# Patient Record
Sex: Male | Born: 1965 | Race: White | Hispanic: No | Marital: Married | Smoking: Never smoker
Health system: Southern US, Community
[De-identification: ages and names within clinical notes are randomized; demographics above are authoritative.]

## PROBLEM LIST (undated history)

## (undated) DIAGNOSIS — D649 Anemia, unspecified: Secondary | ICD-10-CM

## (undated) DIAGNOSIS — G709 Myoneural disorder, unspecified: Secondary | ICD-10-CM

## (undated) DIAGNOSIS — I10 Essential (primary) hypertension: Secondary | ICD-10-CM

## (undated) DIAGNOSIS — M1990 Unspecified osteoarthritis, unspecified site: Secondary | ICD-10-CM

## (undated) HISTORY — PX: KNEE ARTHROSCOPY WITH ANTERIOR CRUCIATE LIGAMENT (ACL) REPAIR: SHX5644

---
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
## 2001-04-17 ENCOUNTER — Encounter: Admission: RE | Admit: 2001-04-17 | Discharge: 2001-04-17 | Payer: Self-pay | Admitting: Orthopedic Surgery

## 2001-04-17 ENCOUNTER — Encounter: Payer: Self-pay | Admitting: Orthopedic Surgery

## 2006-11-09 ENCOUNTER — Encounter: Admission: RE | Admit: 2006-11-09 | Discharge: 2006-11-09 | Payer: Self-pay | Admitting: Sports Medicine

## 2013-10-09 ENCOUNTER — Encounter (HOSPITAL_COMMUNITY): Payer: Self-pay | Admitting: Pharmacy Technician

## 2013-10-10 NOTE — H&P (Signed)
TOTAL HIP ADMISSION H&P  Patient is admitted for left total hip arthroplasty, anterior approach.  Subjective:  Chief Complaint:    Left hip OA / pain  HPI: Clinton Williams, 47 y.o. male, has a history of pain and functional disability in the right hip(s) due to arthritis and patient has failed non-surgical conservative treatments for greater than 12 weeks to include NSAID's and/or analgesics, corticosteriod injections, use of assistive devices and activity modification.  Onset of symptoms was gradual starting 2+ years ago with gradually worsening course since that time.The patient noted no past surgery on the left hip(s).  Patient currently rates pain in the left hip at 10 out of 10 with activity. Patient has night pain, worsening of pain with activity and weight bearing, trendelenberg gait, pain that interfers with activities of daily living and pain with passive range of motion. Patient has evidence of periarticular osteophytes and joint space narrowing by imaging studies. This condition presents safety issues increasing the risk of falls.  There is no current active infection.  Risks, benefits and expectations were discussed with the patient.  Risks including but not limited to the risk of anesthesia, blood clots, nerve damage, blood vessel damage, failure of the prosthesis, infection and up to and including death.  Patient understand the risks, benefits and expectations and wishes to proceed with surgery.   PCP: No primary provider on file.  D/C Plans:      Home with HHPT  Post-op Meds:       No Rx given  Tranexamic Acid:      To be given - IV    Decadron:      Is to be given  FYI:     ASA post-op  Norco post-op     Past Medical History  Diagnosis Date  . Hypertension   . Neuromuscular disorder     oa     Past Surgical History  Procedure Laterality Date  . Knee arthroscopy with anterior cruciate ligament (acl) repair Bilateral 2003 and 1996  . Knee arthrscopy Right 1981    No  Known Allergies    History  Substance Use Topics  . Smoking status: Never Smoker   . Smokeless tobacco: Never Used  . Alcohol Use: Yes     Comment: occasional     Review of Systems  Constitutional: Negative.   HENT: Negative.   Eyes: Negative.   Respiratory: Negative.   Cardiovascular: Negative.   Gastrointestinal: Negative.   Genitourinary: Negative.   Musculoskeletal: Positive for joint pain.  Skin: Negative.   Neurological: Negative.   Endo/Heme/Allergies: Negative.   Psychiatric/Behavioral: Negative.     Objective:  Physical Exam  Constitutional: He is oriented to person, place, and time. He appears well-developed and well-nourished.  HENT:  Head: Normocephalic and atraumatic.  Eyes: Pupils are equal, round, and reactive to light.  Neck: Neck supple. No JVD present. No tracheal deviation present. No thyromegaly present.  Cardiovascular: Normal rate, regular rhythm, normal heart sounds and intact distal pulses.   Respiratory: Effort normal and breath sounds normal. No stridor. No respiratory distress. He has no wheezes.  GI: Soft. There is no tenderness. There is no guarding.  Musculoskeletal:       Left hip: He exhibits decreased range of motion, decreased strength, tenderness and bony tenderness. He exhibits no swelling, no deformity and no laceration.  Lymphadenopathy:    He has no cervical adenopathy.  Neurological: He is alert and oriented to person, place, and time.  Skin: Skin is  warm and dry.  Psychiatric: He has a normal mood and affect.      Imaging Review Plain radiographs demonstrate severe degenerative joint disease of the left hip(s). The bone quality appears to be good for age and reported activity level.  Assessment/Plan:  End stage arthritis, left hip(s)  The patient history, physical examination, clinical judgement of the provider and imaging studies are consistent with end stage degenerative joint disease of the left hip(s) and total hip  arthroplasty is deemed medically necessary. The treatment options including medical management, injection therapy, arthroscopy and arthroplasty were discussed at length. The risks and benefits of total hip arthroplasty were presented and reviewed. The risks due to aseptic loosening, infection, stiffness, dislocation/subluxation,  thromboembolic complications and other imponderables were discussed.  The patient acknowledged the explanation, agreed to proceed with the plan and consent was signed. Patient is being admitted for inpatient treatment for surgery, pain control, PT, OT, prophylactic antibiotics, VTE prophylaxis, progressive ambulation and ADL's and discharge planning.The patient is planning to be discharged home with home health services.     Anastasio Auerbach Evani Shrider   PA-C  10/10/2013, 11:12 AM

## 2013-10-16 ENCOUNTER — Other Ambulatory Visit (HOSPITAL_COMMUNITY): Payer: Self-pay | Admitting: Anesthesiology

## 2013-10-16 NOTE — Progress Notes (Signed)
ekg and medical clearance note dr Caryn Beekevin little 08-29-13 on chart

## 2013-10-16 NOTE — Patient Instructions (Addendum)
18 Clinton Williams  10/16/2013   Your procedure is scheduled on: Tuesday oct 6th, 2015  [facility] and follow signs to  Short Stay Center at 830 AM.  Call this number if you have problems the morning of surgery [phone]   Remember:  Do not eat food or drink liquids :After Midnight.     Take these medicines the morning of surgery with A SIP OF WATER: no meds to take                               You may not have any metal on your body including hair pins and piercings  Do not wear jewelry, make-up, lotions, powders, or deodorant.   Men may shave face and neck.  Do not bring valuables to the hospital. [facility] IS NOT RESPONSIBLE FOR VALUABLES.  Contacts, dentures or bridgework may not be worn into surgery.  Leave suitcase in the car. After surgery it may be brought to your room.  For patients admitted to the hospital, checkout time is 11:00 AM the day of discharge.   Patients discharged the day of surgery will not be allowed to drive home.  Name and phone number of your driver:  Special Instructions: N/A ________________________________________________________________________  [facility] - Preparing for Surgery Before surgery, you can play an important role.  Because skin is not sterile, your skin needs to be as free of germs as possible.  You can reduce the number of germs on your skin by washing with CHG (chlorahexidine gluconate) soap before surgery.  CHG is an antiseptic cleaner which kills germs and bonds with the skin to continue killing germs even after washing. Please DO NOT use if you have an allergy to CHG or antibacterial soaps.  If your skin becomes reddened/irritated stop using the CHG and inform your nurse when you arrive at Short Stay. Do not shave (including legs and underarms) for at least 48 hours prior to the first CHG shower.  You may shave your face/neck. Please follow these instructions carefully:  1.  Shower with CHG Soap the  night before surgery and the  morning of Surgery.  2.  If you choose to wash your hair, wash your hair first as usual with your  normal  shampoo.  3.  After you shampoo, rinse your hair and body thoroughly to remove the  shampoo.                           4.  Use CHG as you would any other liquid soap.  You can apply chg directly  to the skin and wash                       Gently with a scrungie or clean washcloth.  5.  Apply the CHG Soap to your body ONLY FROM THE NECK DOWN.   Do not use on face/ open                           Wound or open sores. Avoid contact with eyes, ears mouth and genitals (private parts).                       Wash face,  Genitals (private parts) with your normal soap.  6.  Wash thoroughly, paying special attention to the area where your surgery  will be performed.  7.  Thoroughly rinse your body with warm water from the neck down.  8.  DO NOT shower/wash with your normal soap after using and rinsing off  the CHG Soap.                9.  Pat yourself dry with a clean towel.            10.  Wear clean pajamas.            11.  Place clean sheets on your bed the night of your first shower and do not  sleep with pets. Day of Surgery : Do not apply any lotions/deodorants the morning of surgery.  Please wear clean clothes to the hospital/surgery center.  FAILURE TO FOLLOW THESE INSTRUCTIONS MAY RESULT IN THE CANCELLATION OF YOUR SURGERY PATIENT SIGNATURE_________________________________  NURSE SIGNATURE__________________________________  ________________________________________________________________________   Clinton Williams  An incentive spirometer is a tool that can help keep your lungs clear and active. This tool measures how well you are filling your lungs with each breath. Taking long deep breaths may help reverse or decrease the chance of developing breathing (pulmonary) problems (especially infection) following:  A long period of time when you  are unable to move or be active. BEFORE THE PROCEDURE   If the spirometer includes an indicator to show your best effort, your nurse or respiratory therapist will set it to a desired goal.  If possible, sit up straight or lean slightly forward. Try not to slouch.  Hold the incentive spirometer in an upright position. INSTRUCTIONS FOR USE  1. Sit on the edge of your bed if possible, or sit up as far as you can in bed or on a chair. 2. Hold the incentive spirometer in an upright position. 3. Breathe out normally. 4. Place the mouthpiece in your mouth and seal your lips tightly around it. 5. Breathe in slowly and as deeply as possible, raising the piston or the ball toward the top of the column. 6. Hold your breath for 3-5 seconds or for as long as possible. Allow the piston or ball to fall to the bottom of the column. 7. Remove the mouthpiece from your mouth and breathe out normally. 8. Rest for a few seconds and repeat Steps 1 through 7 at least 10 times every 1-2 hours when you are awake. Take your time and take a few normal breaths between deep breaths. 9. The spirometer may include an indicator to show your best effort. Use the indicator as a goal to work toward during each repetition. 10. After each set of 10 deep breaths, practice coughing to be sure your lungs are clear. If you have an incision (the cut made at the time of surgery), support your incision when coughing by placing a pillow or rolled up towels firmly against it. Once you are able to get out of bed, walk around indoors and cough well. You may stop using the incentive spirometer when instructed by your caregiver.  RISKS AND COMPLICATIONS  Take your time so you do not get dizzy or light-headed.  If you are in pain, you may need to take or ask for pain medication before doing incentive spirometry. It is harder to take a deep breath if you are having pain. AFTER USE  Rest and breathe slowly and easily.  It can be helpful to  keep track of a log of  your progress. Your caregiver can provide you with a simple table to help with this. If you are using the spirometer at home, follow these instructions: [place] IF:   You are having difficultly using the spirometer.  You have trouble using the spirometer as often as instructed.  Your pain medication is not giving enough relief while using the spirometer.  You develop fever of 100.5 F (38.1 C) or higher. SEEK IMMEDIATE MEDICAL CARE IF:   You cough up bloody sputum that had not been present before.  You develop fever of 102 F (38.9 C) or greater.  You develop worsening pain at or near the incision site. MAKE SURE YOU:   Understand these instructions.  Will watch your condition.  Will get help right away if you are not doing well or get worse. Document Released: 05/17/2006 Document Revised: 03/29/2011 Document Reviewed: 07/18/2006 Exit[employer] Patient Information 2014 Exit[employer], [place].   ________________________________________________________________________  WHAT IS A BLOOD TRANSFUSION? Blood Transfusion Information  A transfusion is the replacement of blood or some of its parts. Blood is made up of multiple cells which provide different functions.  Red blood cells carry oxygen and are used for blood loss replacement.  White blood cells fight against infection.  Platelets control bleeding.  Plasma helps clot blood.  Other blood products are available for specialized needs, such as hemophilia or other clotting disorders. BEFORE THE TRANSFUSION  Who gives blood for transfusions?   Healthy volunteers who are fully evaluated to make sure their blood is safe. This is blood bank blood. Transfusion therapy is the safest it has ever been in the practice of medicine. Before blood is taken from a donor, a complete history is taken to make sure that person has no history of diseases nor engages in risky social behavior (examples are intravenous drug  use or sexual activity with multiple partners). The donor's travel history is screened to minimize risk of transmitting infections, such as malaria. The donated blood is tested for signs of infectious diseases, such as HIV and hepatitis. The blood is then tested to be sure it is compatible with you in order to minimize the chance of a transfusion reaction. If you or a relative donates blood, this is often done in anticipation of surgery and is not appropriate for emergency situations. It takes many days to process the donated blood. RISKS AND COMPLICATIONS Although transfusion therapy is very safe and saves many lives, the main dangers of transfusion include:   Getting an infectious disease.  Developing a transfusion reaction. This is an allergic reaction to something in the blood you were given. Every precaution is taken to prevent this. The decision to have a blood transfusion has been considered carefully by your caregiver before blood is given. Blood is not given unless the benefits outweigh the risks. AFTER THE TRANSFUSION  Right after receiving a blood transfusion, you will usually feel much better and more energetic. This is especially true if your red blood cells have gotten low (anemic). The transfusion raises the level of the red blood cells which carry oxygen, and this usually causes an energy increase.  The nurse administering the transfusion will monitor you carefully for complications. HOME CARE INSTRUCTIONS  No special instructions are needed after a transfusion. You may find your energy is better. Speak with your caregiver about any limitations on activity for underlying diseases you may have. SEEK MEDICAL CARE IF:   Your condition is not improving after your transfusion.  You develop redness or  irritation at the intravenous (IV) site. SEEK IMMEDIATE MEDICAL CARE IF:  Any of the following symptoms occur over the next 12 hours:  Shaking chills.  You have a temperature by mouth  above 102 F (38.9 C), not controlled by medicine.  Chest, back, or muscle pain.  People around you feel you are not acting correctly or are confused.  Shortness of breath or difficulty breathing.  Dizziness and fainting.  You get a rash or develop hives.  You have a decrease in urine output.  Your urine turns a dark color or changes to pink, red, or brown. Any of the following symptoms occur over the next 10 days:  You have a temperature by mouth above 102 F (38.9 C), not controlled by medicine.  Shortness of breath.  Weakness after normal activity.  The white part of the eye turns yellow (jaundice).  You have a decrease in the amount of urine or are urinating less often.  Your urine turns a dark color or changes to pink, red, or brown. Document Released: 01/02/2000 Document Revised: 03/29/2011 Document Reviewed: 08/21/2007 [facility] Patient Information 2014 [place], [place].  _______________________________________________________________________

## 2013-10-17 ENCOUNTER — Encounter (HOSPITAL_COMMUNITY)
Admission: RE | Admit: 2013-10-17 | Discharge: 2013-10-17 | Disposition: A | Discharge status: Home / Self Care | Payer: Managed Care, Other (non HMO) | Source: Ambulatory Visit | Attending: Orthopedic Surgery | Admitting: Orthopedic Surgery

## 2013-10-17 ENCOUNTER — Ambulatory Visit (HOSPITAL_COMMUNITY)
Admission: RE | Admit: 2013-10-17 | Discharge: 2013-10-17 | Disposition: A | Discharge status: Home / Self Care | Payer: Managed Care, Other (non HMO) | Source: Ambulatory Visit | Attending: Anesthesiology | Admitting: Anesthesiology

## 2013-10-17 ENCOUNTER — Encounter (HOSPITAL_COMMUNITY): Payer: Self-pay

## 2013-10-17 DIAGNOSIS — M169 Osteoarthritis of hip, unspecified: Secondary | ICD-10-CM | POA: Diagnosis not present

## 2013-10-17 DIAGNOSIS — I10 Essential (primary) hypertension: Secondary | ICD-10-CM | POA: Insufficient documentation

## 2013-10-17 DIAGNOSIS — Z01812 Encounter for preprocedural laboratory examination: Secondary | ICD-10-CM | POA: Diagnosis present

## 2013-10-17 DIAGNOSIS — Z01810 Encounter for preprocedural cardiovascular examination: Secondary | ICD-10-CM | POA: Diagnosis present

## 2013-10-17 DIAGNOSIS — M1610 Unilateral primary osteoarthritis, unspecified hip: Secondary | ICD-10-CM | POA: Diagnosis present

## 2013-10-17 HISTORY — DX: Myoneural disorder, unspecified: G70.9

## 2013-10-17 HISTORY — DX: Essential (primary) hypertension: I10

## 2013-10-17 LAB — BASIC METABOLIC PANEL
ANION GAP: 13 (ref 5–15)
BUN: 25 mg/dL — ABNORMAL HIGH (ref 6–23)
CHLORIDE: 104 meq/L (ref 96–112)
CO2: 24 mEq/L (ref 19–32)
Calcium: 9.9 mg/dL (ref 8.4–10.5)
Creatinine, Ser: 1 mg/dL (ref 0.50–1.35)
GFR calc Af Amer: >90 mL/min (ref >90)
GFR calc non Af Amer: 86 mL/min — ABNORMAL LOW (ref >90)
Glucose, Bld: 87 mg/dL (ref 70–99)
POTASSIUM: 4.5 meq/L (ref 3.7–5.3)
Sodium: 141 mEq/L (ref 137–147)

## 2013-10-17 LAB — URINALYSIS, ROUTINE W REFLEX MICROSCOPIC
BILIRUBIN URINE: NEGATIVE
Glucose, UA: NEGATIVE mg/dL
HGB URINE DIPSTICK: NEGATIVE
KETONES UR: NEGATIVE mg/dL
Leukocytes, UA: NEGATIVE
NITRITE: NEGATIVE
PH: 5.5 (ref 5.0–8.0)
Protein, ur: NEGATIVE mg/dL
Specific Gravity, Urine: 1.025 (ref 1.005–1.030)
Urobilinogen, UA: 0.2 mg/dL (ref 0.0–1.0)

## 2013-10-17 LAB — PROTIME-INR
INR: 0.99 (ref 0.00–1.49)
PROTHROMBIN TIME: 13.1 s (ref 11.6–15.2)

## 2013-10-17 LAB — CBC
HCT: 39.4 % (ref 39.0–52.0)
HEMOGLOBIN: 13.9 g/dL (ref 13.0–17.0)
MCH: 30.3 pg (ref 26.0–34.0)
MCHC: 35.3 g/dL (ref 30.0–36.0)
MCV: 86 fL (ref 78.0–100.0)
PLATELETS: 272 10*3/uL (ref 150–400)
RBC: 4.58 MIL/uL (ref 4.22–5.81)
RDW: 12.9 % (ref 11.5–15.5)
WBC: 6.4 10*3/uL (ref 4.0–10.5)

## 2013-10-17 LAB — APTT: aPTT: 27 seconds (ref 24–37)

## 2013-10-17 LAB — SURGICAL PCR SCREEN
MRSA, PCR: NEGATIVE
Staphylococcus aureus: NEGATIVE

## 2013-10-17 LAB — ABO/RH: ABO/RH(D): O POS

## 2013-10-17 NOTE — Progress Notes (Signed)
10/17/13 0837  OBSTRUCTIVE SLEEP APNEA  Have you ever been diagnosed with sleep apnea through a sleep study? No  Do you snore loudly (loud enough to be heard through closed doors)?  1  Do you often feel tired, fatigued, or sleepy during the daytime? 1  Has anyone observed you stop breathing during your sleep? 0  Do you have, or are you being treated for high blood pressure? 1  BMI more than 35 kg/m2? 0  Age over 50 years old? 1  Neck circumference greater than 40 cm/16 inches? 1  Gender: 1  Obstructive Sleep Apnea Score 6  Score 4 or greater  Results sent to PCP

## 2013-10-23 ENCOUNTER — Encounter (HOSPITAL_COMMUNITY): Payer: Managed Care, Other (non HMO) | Admitting: Anesthesiology

## 2013-10-23 ENCOUNTER — Inpatient Hospital Stay (HOSPITAL_COMMUNITY): Payer: Managed Care, Other (non HMO) | Admitting: Anesthesiology

## 2013-10-23 ENCOUNTER — Inpatient Hospital Stay (HOSPITAL_COMMUNITY)
Admission: RE | Admit: 2013-10-23 | Discharge: 2013-10-24 | DRG: 470 | Disposition: A | Discharge status: Home Health | Payer: Managed Care, Other (non HMO) | Source: Ambulatory Visit | Attending: Orthopedic Surgery | Admitting: Orthopedic Surgery

## 2013-10-23 ENCOUNTER — Encounter (HOSPITAL_COMMUNITY)
Admission: RE | Disposition: A | Discharge status: Home Health | Payer: Self-pay | Source: Ambulatory Visit | Attending: Orthopedic Surgery

## 2013-10-23 ENCOUNTER — Encounter (HOSPITAL_COMMUNITY): Payer: Self-pay | Admitting: Anesthesiology

## 2013-10-23 ENCOUNTER — Inpatient Hospital Stay (HOSPITAL_COMMUNITY): Payer: Managed Care, Other (non HMO)

## 2013-10-23 DIAGNOSIS — Z6828 Body mass index (BMI) 28.0-28.9, adult: Secondary | ICD-10-CM | POA: Diagnosis not present

## 2013-10-23 DIAGNOSIS — M1612 Unilateral primary osteoarthritis, left hip: Secondary | ICD-10-CM | POA: Diagnosis present

## 2013-10-23 DIAGNOSIS — I10 Essential (primary) hypertension: Secondary | ICD-10-CM | POA: Diagnosis present

## 2013-10-23 DIAGNOSIS — E663 Overweight: Secondary | ICD-10-CM | POA: Diagnosis present

## 2013-10-23 DIAGNOSIS — Z96649 Presence of unspecified artificial hip joint: Secondary | ICD-10-CM

## 2013-10-23 DIAGNOSIS — M25552 Pain in left hip: Secondary | ICD-10-CM | POA: Diagnosis present

## 2013-10-23 HISTORY — PX: TOTAL HIP ARTHROPLASTY: SHX124

## 2013-10-23 LAB — TYPE AND SCREEN
ABO/RH(D): O POS
Antibody Screen: NEGATIVE

## 2013-10-23 SURGERY — ARTHROPLASTY, HIP, TOTAL, ANTERIOR APPROACH
Anesthesia: Spinal | Site: Hip | Laterality: Left

## 2013-10-23 MED ORDER — PHENYLEPHRINE 40 MCG/ML (10ML) SYRINGE FOR IV PUSH (FOR BLOOD PRESSURE SUPPORT)
PREFILLED_SYRINGE | INTRAVENOUS | Status: AC
  Filled 2013-10-23: qty 10

## 2013-10-23 MED ORDER — DEXAMETHASONE SODIUM PHOSPHATE 10 MG/ML IJ SOLN
10.0000 mg | Freq: Once | INTRAMUSCULAR | Status: DC
  Filled 2013-10-23: qty 1

## 2013-10-23 MED ORDER — LACTATED RINGERS IV SOLN: INTRAVENOUS | Status: DC

## 2013-10-23 MED ORDER — SODIUM CHLORIDE 0.9 % IR SOLN
Status: DC | PRN
  Administered 2013-10-23: 1000 mL

## 2013-10-23 MED ORDER — CEFAZOLIN SODIUM-DEXTROSE 2-3 GM-% IV SOLR
2.0000 g | INTRAVENOUS | Status: AC
Start: 2013-10-23 — End: 2013-10-23
  Administered 2013-10-23: 2 g via INTRAVENOUS

## 2013-10-23 MED ORDER — ONDANSETRON HCL 4 MG/2ML IJ SOLN: 4.0000 mg | Freq: Four times a day (QID) | INTRAMUSCULAR | Status: DC | PRN

## 2013-10-23 MED ORDER — PROPOFOL 10 MG/ML IV BOLUS
INTRAVENOUS | Status: AC
  Filled 2013-10-23: qty 20

## 2013-10-23 MED ORDER — MAGNESIUM CITRATE PO SOLN: 1.0000 | Freq: Once | ORAL | Status: AC | PRN

## 2013-10-23 MED ORDER — SODIUM CHLORIDE 0.9 % IV SOLN
100.0000 mL/h | INTRAVENOUS | Status: DC
  Administered 2013-10-23: 100 mL/h via INTRAVENOUS
  Filled 2013-10-23 (×4): qty 1000

## 2013-10-23 MED ORDER — PROPOFOL 10 MG/ML IV BOLUS
INTRAVENOUS | Status: DC | PRN
  Administered 2013-10-23: 30 mg via INTRAVENOUS

## 2013-10-23 MED ORDER — METOCLOPRAMIDE HCL 10 MG PO TABS: 5.0000 mg | ORAL_TABLET | Freq: Three times a day (TID) | ORAL | Status: DC | PRN

## 2013-10-23 MED ORDER — HYDROMORPHONE HCL 1 MG/ML IJ SOLN: 0.2500 mg | INTRAMUSCULAR | Status: DC | PRN

## 2013-10-23 MED ORDER — PHENYLEPHRINE HCL 10 MG/ML IJ SOLN
10.0000 mg | INTRAVENOUS | Status: DC | PRN
  Administered 2013-10-23: 25 ug/min via INTRAVENOUS

## 2013-10-23 MED ORDER — ONDANSETRON HCL 4 MG PO TABS: 4.0000 mg | ORAL_TABLET | Freq: Four times a day (QID) | ORAL | Status: DC | PRN

## 2013-10-23 MED ORDER — DEXAMETHASONE SODIUM PHOSPHATE 10 MG/ML IJ SOLN
10.0000 mg | Freq: Once | INTRAMUSCULAR | Status: AC
  Administered 2013-10-23: 10 mg via INTRAVENOUS

## 2013-10-23 MED ORDER — AMLODIPINE BESYLATE 10 MG PO TABS
10.0000 mg | ORAL_TABLET | Freq: Every evening | ORAL | Status: DC
  Filled 2013-10-23 (×2): qty 1

## 2013-10-23 MED ORDER — CEFAZOLIN SODIUM-DEXTROSE 2-3 GM-% IV SOLR: 2.0000 g | INTRAVENOUS | Status: DC

## 2013-10-23 MED ORDER — PROMETHAZINE HCL 25 MG/ML IJ SOLN: 6.2500 mg | INTRAMUSCULAR | Status: DC | PRN

## 2013-10-23 MED ORDER — CEFAZOLIN SODIUM-DEXTROSE 2-3 GM-% IV SOLR
2.0000 g | Freq: Four times a day (QID) | INTRAVENOUS | Status: AC
  Administered 2013-10-23 (×2): 2 g via INTRAVENOUS
  Filled 2013-10-23 (×2): qty 50

## 2013-10-23 MED ORDER — TRANEXAMIC ACID 100 MG/ML IV SOLN
1000.0000 mg | Freq: Once | INTRAVENOUS | Status: AC
  Administered 2013-10-23: 1000 mg via INTRAVENOUS
  Filled 2013-10-23: qty 10

## 2013-10-23 MED ORDER — DOCUSATE SODIUM 100 MG PO CAPS
100.0000 mg | ORAL_CAPSULE | Freq: Two times a day (BID) | ORAL | Status: DC
  Administered 2013-10-23 – 2013-10-24 (×2): 100 mg via ORAL

## 2013-10-23 MED ORDER — LACTATED RINGERS IV SOLN
INTRAVENOUS | Status: DC
  Administered 2013-10-23 (×2): via INTRAVENOUS
  Administered 2013-10-23: 1000 mL via INTRAVENOUS

## 2013-10-23 MED ORDER — PROPOFOL 10 MG/ML IV BOLUS
INTRAVENOUS | Status: AC
Start: 2013-10-23 — End: 2013-10-23
  Filled 2013-10-23: qty 20

## 2013-10-23 MED ORDER — CEFAZOLIN SODIUM-DEXTROSE 2-3 GM-% IV SOLR
INTRAVENOUS | Status: AC
  Filled 2013-10-23: qty 50

## 2013-10-23 MED ORDER — DIPHENHYDRAMINE HCL 25 MG PO CAPS: 25.0000 mg | ORAL_CAPSULE | Freq: Four times a day (QID) | ORAL | Status: DC | PRN

## 2013-10-23 MED ORDER — ALUM & MAG HYDROXIDE-SIMETH 200-200-20 MG/5ML PO SUSP: 30.0000 mL | ORAL | Status: DC | PRN

## 2013-10-23 MED ORDER — METOCLOPRAMIDE HCL 5 MG/ML IJ SOLN: 5.0000 mg | Freq: Three times a day (TID) | INTRAMUSCULAR | Status: DC | PRN

## 2013-10-23 MED ORDER — BISACODYL 10 MG RE SUPP: 10.0000 mg | Freq: Every day | RECTAL | Status: DC | PRN

## 2013-10-23 MED ORDER — METHOCARBAMOL 500 MG PO TABS
500.0000 mg | ORAL_TABLET | Freq: Four times a day (QID) | ORAL | Status: DC | PRN
  Administered 2013-10-24 (×2): 500 mg via ORAL
  Filled 2013-10-23 (×2): qty 1

## 2013-10-23 MED ORDER — POLYETHYLENE GLYCOL 3350 17 G PO PACK
17.0000 g | PACK | Freq: Two times a day (BID) | ORAL | Status: DC
  Administered 2013-10-24: 17 g via ORAL

## 2013-10-23 MED ORDER — CELECOXIB 200 MG PO CAPS
200.0000 mg | ORAL_CAPSULE | Freq: Two times a day (BID) | ORAL | Status: DC
  Administered 2013-10-23 – 2013-10-24 (×2): 200 mg via ORAL
  Filled 2013-10-23 (×3): qty 1

## 2013-10-23 MED ORDER — FENTANYL CITRATE 0.05 MG/ML IJ SOLN
INTRAMUSCULAR | Status: AC
  Filled 2013-10-23: qty 2

## 2013-10-23 MED ORDER — MEPERIDINE HCL 50 MG/ML IJ SOLN: 6.2500 mg | INTRAMUSCULAR | Status: DC | PRN

## 2013-10-23 MED ORDER — MIDAZOLAM HCL 2 MG/2ML IJ SOLN
INTRAMUSCULAR | Status: AC
  Filled 2013-10-23: qty 2

## 2013-10-23 MED ORDER — ASPIRIN EC 325 MG PO TBEC
325.0000 mg | DELAYED_RELEASE_TABLET | Freq: Two times a day (BID) | ORAL | Status: DC
  Administered 2013-10-24: 325 mg via ORAL
  Filled 2013-10-23 (×3): qty 1

## 2013-10-23 MED ORDER — HYDROCODONE-ACETAMINOPHEN 7.5-325 MG PO TABS
1.0000 | ORAL_TABLET | ORAL | Status: DC
  Administered 2013-10-23 – 2013-10-24 (×4): 2 via ORAL
  Administered 2013-10-24: 1 via ORAL
  Administered 2013-10-24: 2 via ORAL
  Filled 2013-10-23 (×6): qty 2

## 2013-10-23 MED ORDER — HYDROCODONE-ACETAMINOPHEN 7.5-325 MG PO TABS: 1.0000 | ORAL_TABLET | ORAL | Status: DC

## 2013-10-23 MED ORDER — CHLORHEXIDINE GLUCONATE 4 % EX LIQD: 60.0000 mL | Freq: Once | CUTANEOUS | Status: DC

## 2013-10-23 MED ORDER — HYDROMORPHONE HCL 1 MG/ML IJ SOLN: 0.5000 mg | INTRAMUSCULAR | Status: DC | PRN

## 2013-10-23 MED ORDER — MIDAZOLAM HCL 5 MG/5ML IJ SOLN
INTRAMUSCULAR | Status: DC | PRN
  Administered 2013-10-23: 2 mg via INTRAVENOUS

## 2013-10-23 MED ORDER — FENTANYL CITRATE 0.05 MG/ML IJ SOLN
INTRAMUSCULAR | Status: DC | PRN
  Administered 2013-10-23: 50 ug via INTRAVENOUS

## 2013-10-23 MED ORDER — MENTHOL 3 MG MT LOZG
1.0000 | LOZENGE | OROMUCOSAL | Status: DC | PRN
  Filled 2013-10-23: qty 9

## 2013-10-23 MED ORDER — METHOCARBAMOL 1000 MG/10ML IJ SOLN
500.0000 mg | Freq: Four times a day (QID) | INTRAMUSCULAR | Status: DC | PRN
  Filled 2013-10-23: qty 5

## 2013-10-23 MED ORDER — FERROUS SULFATE 325 (65 FE) MG PO TABS
325.0000 mg | ORAL_TABLET | Freq: Three times a day (TID) | ORAL | Status: DC
Start: 2013-10-23 — End: 2013-10-24
  Administered 2013-10-24: 325 mg via ORAL
  Filled 2013-10-23 (×5): qty 1

## 2013-10-23 MED ORDER — PHENYLEPHRINE HCL 10 MG/ML IJ SOLN
INTRAMUSCULAR | Status: DC | PRN
  Administered 2013-10-23: 120 ug via INTRAVENOUS
  Administered 2013-10-23: 160 ug via INTRAVENOUS
  Administered 2013-10-23: 120 ug via INTRAVENOUS

## 2013-10-23 MED ORDER — BUPIVACAINE HCL (PF) 0.5 % IJ SOLN
INTRAMUSCULAR | Status: AC
  Filled 2013-10-23: qty 30

## 2013-10-23 MED ORDER — PHENOL 1.4 % MT LIQD
1.0000 | OROMUCOSAL | Status: DC | PRN
  Filled 2013-10-23: qty 177

## 2013-10-23 MED ORDER — PROPOFOL INFUSION 10 MG/ML OPTIME
INTRAVENOUS | Status: DC | PRN
  Administered 2013-10-23: 180 ug/kg/min via INTRAVENOUS

## 2013-10-23 SURGICAL SUPPLY — 40 items
ADH SKN CLS APL DERMABOND .7 (GAUZE/BANDAGES/DRESSINGS) ×1
BAG SPEC THK2 15X12 ZIP CLS (MISCELLANEOUS)
BAG ZIPLOCK 12X15 (MISCELLANEOUS) IMPLANT
CAPT HIP PF COP ×2 IMPLANT
COVER PERINEAL POST (MISCELLANEOUS) ×3 IMPLANT
DERMABOND ADVANCED (GAUZE/BANDAGES/DRESSINGS) ×2
DERMABOND ADVANCED .7 DNX12 (GAUZE/BANDAGES/DRESSINGS) ×1 IMPLANT
DRAPE C-ARM 42X120 X-RAY (DRAPES) ×3 IMPLANT
DRAPE STERI IOBAN 125X83 (DRAPES) ×3 IMPLANT
DRAPE U-SHAPE 47X51 STRL (DRAPES) ×9 IMPLANT
DRSG AQUACEL AG ADV 3.5X10 (GAUZE/BANDAGES/DRESSINGS) ×3 IMPLANT
DURAPREP 26ML APPLICATOR (WOUND CARE) ×3 IMPLANT
ELECT BLADE TIP CTD 4 INCH (ELECTRODE) ×3 IMPLANT
ELECT PENCIL ROCKER SW 15FT (MISCELLANEOUS) IMPLANT
ELECT REM PT RETURN 15FT ADLT (MISCELLANEOUS) IMPLANT
ELECT REM PT RETURN 9FT ADLT (ELECTROSURGICAL) ×3
ELECTRODE REM PT RTRN 9FT ADLT (ELECTROSURGICAL) ×1 IMPLANT
FACESHIELD WRAPAROUND (MASK) ×12 IMPLANT
FACESHIELD WRAPAROUND OR TEAM (MASK) ×4 IMPLANT
GLOVE BIOGEL PI IND STRL 7.5 (GLOVE) ×1 IMPLANT
GLOVE BIOGEL PI IND STRL 8.5 (GLOVE) ×1 IMPLANT
GLOVE BIOGEL PI INDICATOR 7.5 (GLOVE) ×2
GLOVE BIOGEL PI INDICATOR 8.5 (GLOVE) ×2
GLOVE ECLIPSE 8.0 STRL XLNG CF (GLOVE) ×3 IMPLANT
GLOVE ORTHO TXT STRL SZ7.5 (GLOVE) ×6 IMPLANT
GOWN SPEC L3 XXLG W/TWL (GOWN DISPOSABLE) ×3 IMPLANT
GOWN STRL REUS W/TWL LRG LVL3 (GOWN DISPOSABLE) ×3 IMPLANT
HOLDER FOLEY CATH W/STRAP (MISCELLANEOUS) ×3 IMPLANT
KIT BASIN OR (CUSTOM PROCEDURE TRAY) ×3 IMPLANT
PACK TOTAL JOINT (CUSTOM PROCEDURE TRAY) ×3 IMPLANT
SAW OSC TIP CART 19.5X105X1.3 (SAW) ×3 IMPLANT
SUT MNCRL AB 4-0 PS2 18 (SUTURE) ×3 IMPLANT
SUT VIC AB 1 CT1 36 (SUTURE) ×9 IMPLANT
SUT VIC AB 2-0 CT1 27 (SUTURE) ×6
SUT VIC AB 2-0 CT1 TAPERPNT 27 (SUTURE) ×2 IMPLANT
SUT VLOC 180 0 24IN GS25 (SUTURE) ×3 IMPLANT
TOWEL OR 17X26 10 PK STRL BLUE (TOWEL DISPOSABLE) ×3 IMPLANT
TOWEL OR NON WOVEN STRL DISP B (DISPOSABLE) IMPLANT
TRAY FOLEY CATH 14FRSI W/METER (CATHETERS) ×3 IMPLANT
WATER STERILE IRR 1500ML POUR (IV SOLUTION) ×3 IMPLANT

## 2013-10-23 NOTE — Anesthesia Preprocedure Evaluation (Addendum)
Anesthesia Evaluation  Patient identified by MRN, date of birth, ID band Patient awake    Reviewed: Allergy & Precautions, H&P , NPO status , Patient's Chart, lab work & pertinent test results  Airway Mallampati: II TM Distance: >3 FB Neck ROM: Full    Dental no notable dental hx.    Pulmonary neg pulmonary ROS,  breath sounds clear to auscultation  Pulmonary exam normal       Cardiovascular hypertension, Pt. on medications negative cardio ROS  Rhythm:Regular Rate:Normal     Neuro/Psych negative neurological ROS  negative psych ROS   GI/Hepatic negative GI ROS, Neg liver ROS,   Endo/Other  negative endocrine ROS  Renal/GU negative Renal ROS  negative genitourinary   Musculoskeletal negative musculoskeletal ROS (+)   Abdominal   Peds negative pediatric ROS (+)  Hematology negative hematology ROS (+)   Anesthesia Other Findings   Reproductive/Obstetrics negative OB ROS                          Anesthesia Physical Anesthesia Plan  ASA: II  Anesthesia Plan: Spinal   Post-op Pain Management:    Induction: Intravenous  Airway Management Planned: Simple Face Mask  Additional Equipment:   Intra-op Plan:   Post-operative Plan: Extubation in OR  Informed Consent: I have reviewed the patients History and Physical, chart, labs and discussed the procedure including the risks, benefits and alternatives for the proposed anesthesia with the patient or authorized representative who has indicated his/her understanding and acceptance.   Dental advisory given  Plan Discussed with: CRNA  Anesthesia Plan Comments:         Anesthesia Quick Evaluation

## 2013-10-23 NOTE — Plan of Care (Signed)
Problem: Consults Goal: Diagnosis- Total Joint Replacement Left anterior hip     

## 2013-10-23 NOTE — Interval H&P Note (Signed)
History and Physical Interval Note:  10/23/2013 9:53 AM  Clinton Moutonobert Grabski  has presented today for surgery, with the diagnosis of LEFT HIP OA  The various methods of treatment have been discussed with the patient and family. After consideration of risks, benefits and other options for treatment, the patient has consented to  Procedure(s): LEFT TOTAL HIP ARTHROPLASTY ANTERIOR APPROACH (Left) as a surgical intervention .  The patient's history has been reviewed, patient examined, no change in status, stable for surgery.  I have reviewed the patient's chart and labs.  Questions were answered to the patient's satisfaction.     Shelda PalLIN,Tayson Schnelle D

## 2013-10-23 NOTE — Progress Notes (Signed)
[facility] is providing the following services: RW and [employer]  If patient discharges after hours, please call [phone].   Clinton Williams 10/23/2013, 4:42 PM

## 2013-10-23 NOTE — Op Note (Signed)
NAME:  Clinton Williams                ACCOUNT NO.: [id number]      MEDICAL RECORD NO.: [id number]      FACPurcell MoutonLITY:  [facility]      PHYSICIAN:  Durene RomansLIN,[name] D  DATE OF BIRTH:  14-Jun-1965     DATE OF PROCEDURE:  10/23/2013                                 OPERATIVE REPORT         PREOPERATIVE DIAGNOSIS: Left  hip osteoarthritis.      POSTOPERATIVE DIAGNOSIS:  Left hip osteoarthritis.      PROCEDURE:  Left total hip replacement through an anterior approach   utilizing DePuy THR system, component size 52mm pinnacle cup, a size 36+4 neutral   Altrex liner, a size 5 Hi Tri Lock stem with a 36+1.5 delta ceramic   ball.      SURGEON:  [name] D. Charlann Boxerlin, M.D.      ASSISTANT:  Skip MayerBlair Roberts, PA-C      ANESTHESIA:  Spinal.      SPECIMENS:  None.      COMPLICATIONS:  None.      BLOOD LOSS:  375 cc     DRAINS:  None.      INDICATION OF THE PROCEDURE:  Clinton MoutonRobert Williams is a 48 y.o. male who had   presented to office for evaluation of left hip pain.  Radiographs revealed   progressive degenerative changes with bone-on-bone   articulation to the  hip joint.  The patient had painful limited range of   motion significantly affecting their overall quality of life.  The patient was failing to    respond to conservative measures, and at this point was ready   to proceed with more definitive measures.  The patient has noted progressive   degenerative changes in his hip, progressive problems and dysfunction   with regarding the hip prior to surgery.  Consent was obtained for   benefit of pain relief.  Specific risk of infection, DVT, component   failure, dislocation, need for revision surgery, as well discussion of   the anterior versus posterior approach were reviewed.  Consent was   obtained for benefit of anterior pain relief through an anterior   approach.      PROCEDURE IN DETAIL:  The patient was brought to operative theater.   Once adequate anesthesia, preoperative  antibiotics, 2gm of Ancef administered.   The patient was positioned supine on the OSI Hanna table.  Once adequate   padding of boney process was carried out, we had predraped out the hip, and  used fluoroscopy to confirm orientation of the pelvis and position.      The left hip was then prepped and draped from proximal iliac crest to   mid thigh with shower curtain technique.      Time-out was performed identifying the patient, planned procedure, and   extremity.     An incision was then made 2 cm distal and lateral to the   anterior superior iliac spine extending over the orientation of the   tensor fascia lata muscle and sharp dissection was carried down to the   fascia of the muscle and protractor placed in the soft tissues.      The fascia was then incised.  The muscle belly was identified and swept  laterally and retractor placed along the superior neck.  Following   cauterization of the circumflex vessels and removing some pericapsular   fat, a second cobra retractor was placed on the inferior neck.  A third   retractor was placed on the anterior acetabulum after elevating the   anterior rectus.  A L-capsulotomy was along the line of the   superior neck to the trochanteric fossa, then extended proximally and   distally.  Tag sutures were placed and the retractors were then placed   intracapsular.  We then identified the trochanteric fossa and   orientation of my neck cut, confirmed this radiographically   and then made a neck osteotomy with the femur on traction.  The femoral   head was removed without difficulty or complication.  Traction was let   off and retractors were placed posterior and anterior around the   acetabulum.      The labrum and foveal tissue were debrided.  I began reaming with a 61mm   reamer and reamed up to 78mm reamer with good bony bed preparation and a 28mm   cup was chosen.  The final 64mm Pinnacle cup was then impacted under fluoroscopy  to confirm  the depth of penetration and orientation with respect to   abduction.  A screw was placed followed by the hole eliminator.  The final   36+4 neutral Altrex liner was impacted with good visualized rim fit.  The cup was positioned anatomically within the acetabular portion of the pelvis.      At this point, the femur was rolled at 80 degrees.  Further capsule was   released off the inferior aspect of the femoral neck.  I then   released the superior capsule proximally.  The hook was placed laterally   along the femur and elevated manually and held in position with the bed   hook.  The leg was then extended and adducted with the leg rolled to 100   degrees of external rotation.  Once the proximal femur was fully   exposed, I used a box osteotome to set orientation.  I then began   broaching with the starting chili pepper broach and passed this by hand and then broached up to 5.  With the 5 broach in place I chose a high offset neck and did a trial reduction.  The offset was appropriate, leg lengths   appeared to be equal, confirmed radiographically.   Given these findings, I went ahead and dislocated the hip, repositioned all   retractors and positioned the right hip in the extended and abducted position.  The final 5 Hi Tri Lock stem was   chosen and it was impacted down to the level of neck cut.  Based on this   and the trial reduction, a 36+1.5 delta ceramic ball was chosen and   impacted onto a clean and dry trunnion, and the hip was reduced.  The   hip had been irrigated throughout the case again at this point.  I did   reapproximate the superior capsular leaflet to the anterior leaflet   using #1 Vicryl.  The fascia of the   tensor fascia lata muscle was then reapproximated using #1 Vicryl and #0 V-lock sutures.  The   remaining wound was closed with 2-0 Vicryl and running 4-0 Monocryl.   The hip was cleaned, dried, and dressed sterilely using Dermabond and   Aquacel dressing.  She was  then brought   to recovery room  in stable condition tolerating the procedure well.    Skip Mayer, PA-C was present for the entirety of the case involved from   preoperative positioning, perioperative retractor management, general   facilitation of the case, as well as primary wound closure as assistant.            Madlyn Frankel Charlann Boxer, M.D.        10/23/2013 12:36 PM

## 2013-10-23 NOTE — Anesthesia Postprocedure Evaluation (Signed)
  Anesthesia Post-op Note  Patient: Clinton MoutonRobert Foerster  Procedure(s) Performed: Procedure(s) (LRB): LEFT TOTAL HIP ARTHROPLASTY ANTERIOR APPROACH (Left)  Patient Location: PACU  Anesthesia Type: Spinal  Level of Consciousness: awake and alert   Airway and Oxygen Therapy: Patient Spontanous Breathing  Post-op Pain: mild  Post-op Assessment: Post-op Vital signs reviewed, Patient's Cardiovascular Status Stable, Respiratory Function Stable, Patent Airway and No signs of Nausea or vomiting  Last Vitals:  Filed Vitals:   10/23/13 1600  BP: 109/69  Pulse: 79  Temp: 36.6 C  Resp: 18    Post-op Vital Signs: stable   Complications: No apparent anesthesia complications

## 2013-10-23 NOTE — Transfer of Care (Signed)
Immediate Anesthesia Transfer of Care Note  Patient: Clinton MoutonRobert Leyba  Procedure(s) Performed: Procedure(s): LEFT TOTAL HIP ARTHROPLASTY ANTERIOR APPROACH (Left)  Patient Location: PACU  Anesthesia Type:Regional and Spinal  Level of Consciousness: awake, alert  and patient cooperative  Airway & Oxygen Therapy: Patient Spontanous Breathing, Patient connected to nasal cannula oxygen and Patient connected to face mask oxygen  Post-op Assessment: Report given to PACU RN and Post -op Vital signs reviewed and stable  Post vital signs: Reviewed and stable  Complications: No apparent anesthesia complications

## 2013-10-24 DIAGNOSIS — E663 Overweight: Secondary | ICD-10-CM | POA: Diagnosis present

## 2013-10-24 LAB — BASIC METABOLIC PANEL
Anion gap: 12 (ref 5–15)
BUN: 15 mg/dL (ref 6–23)
CALCIUM: 8.4 mg/dL (ref 8.4–10.5)
CHLORIDE: 101 meq/L (ref 96–112)
CO2: 22 mEq/L (ref 19–32)
Creatinine, Ser: 0.92 mg/dL (ref 0.50–1.35)
GFR calc Af Amer: >90 mL/min (ref >90)
GFR calc non Af Amer: >90 mL/min (ref >90)
GLUCOSE: 192 mg/dL — AB (ref 70–99)
POTASSIUM: 4.3 meq/L (ref 3.7–5.3)
SODIUM: 135 meq/L — AB (ref 137–147)

## 2013-10-24 LAB — CBC
HCT: 33.4 % — ABNORMAL LOW (ref 39.0–52.0)
HEMOGLOBIN: 11.5 g/dL — AB (ref 13.0–17.0)
MCH: 29.9 pg (ref 26.0–34.0)
MCHC: 34.4 g/dL (ref 30.0–36.0)
MCV: 86.8 fL (ref 78.0–100.0)
Platelets: 217 10*3/uL (ref 150–400)
RBC: 3.85 MIL/uL — AB (ref 4.22–5.81)
RDW: 13.2 % (ref 11.5–15.5)
WBC: 13.9 10*3/uL — ABNORMAL HIGH (ref 4.0–10.5)

## 2013-10-24 MED ORDER — DSS 100 MG PO CAPS: 100.0000 mg | ORAL_CAPSULE | Freq: Two times a day (BID) | ORAL | Status: DC

## 2013-10-24 MED ORDER — ASPIRIN 325 MG PO TBEC: 325.0000 mg | DELAYED_RELEASE_TABLET | Freq: Two times a day (BID) | ORAL | Status: DC

## 2013-10-24 MED ORDER — HYDROCODONE-ACETAMINOPHEN 7.5-325 MG PO TABS: 1.0000 | ORAL_TABLET | ORAL | Status: DC | PRN

## 2013-10-24 MED ORDER — POLYETHYLENE GLYCOL 3350 17 G PO PACK: 17.0000 g | PACK | Freq: Two times a day (BID) | ORAL | Status: DC

## 2013-10-24 MED ORDER — FERROUS SULFATE 325 (65 FE) MG PO TABS: 325.0000 mg | ORAL_TABLET | Freq: Three times a day (TID) | ORAL | Status: DC

## 2013-10-24 MED ORDER — METHOCARBAMOL 500 MG PO TABS: 500.0000 mg | ORAL_TABLET | Freq: Four times a day (QID) | ORAL | Status: DC | PRN

## 2013-10-24 NOTE — Progress Notes (Signed)
RN reviewed discharge instructions with patient and family. All questions answered.   Paperwork and prescriptions given.   NT rolled patient down in wheelchair to family vehicle.   Patient discharged with DME and has [facility].

## 2013-10-24 NOTE — Evaluation (Signed)
Occupational Therapy Evaluation Patient Details Name: Clinton Williams MRN: [id number] DOB: 1965-09-02 Today's Date: 10/24/2013    History of Present Illness s/p L DA THA   Clinical Impression   This 48 year old man was admitted for the above surgery.  All education was completed.  Pt does not need any further OT at this time.      Follow Up Recommendations  No OT follow up    Equipment Recommendations  3 in 1 bedside comode (delivered)    Recommendations for Other Services       Precautions / Restrictions Precautions Precautions: None Restrictions Weight Bearing Restrictions: No      Mobility Bed Mobility                  Transfers   Equipment used: Rolling walker (2 wheeled)   Sit to Stand: Supervision         General transfer comment: cues for LE placement for comfort    Balance                                            ADL Overall ADL's : Needs assistance/impaired                 Upper Body Dressing : Set up;Sitting   Lower Body Dressing: Supervision/safety;With adaptive equipment;Sit to/from stand   Toilet Transfer: Supervision/safety;BSC;Ambulation       Tub/ Banker: Supervision/safety;Walk-in shower;Ambulation     General ADL Comments: Pt bought AE kit:  educated and used this today to get dressed.  Practiced bathroom transfers and educated on 3:1 for use in shower, having wife wipe legs off afterwards as well as using splash guard and removing back bar if needed over commode.  Educated on tight spaces/sidestepping.     Vision                     Perception     Praxis      Pertinent Vitals/Pain Pain Assessment: No/denies pain     Hand Dominance     Extremity/Trunk Assessment Upper Extremity Assessment Upper Extremity Assessment: Overall WFL for tasks assessed           Communication Communication Communication: No difficulties   Cognition Arousal/Alertness:  Awake/alert Behavior During Therapy: WFL for tasks assessed/performed Overall Cognitive Status: Within Functional Limits for tasks assessed                     General Comments       Exercises       Shoulder Instructions      Home Living Family/patient expects to be discharged to:: Private residence Living Arrangements: Spouse/significant other                 Bathroom Shower/Tub: Occupational psychologist: Standard         Additional Comments: 3:1 and RW delivered to room      Prior Functioning/Environment Level of Independence: Independent             OT Diagnosis: Generalized weakness   OT Problem List:     OT Treatment/Interventions:      OT Goals(Current goals can be found in the care plan section)    OT Frequency:     Barriers to D/C:            Co-evaluation  End of Session    Activity Tolerance: Patient tolerated treatment well Patient left: in chair;with call bell/phone within reach;with family/visitor present   Time: 1552-0802 OT Time Calculation (min): 15 min Charges:  OT General Charges $OT Visit: 1 Procedure OT Evaluation $Initial OT Evaluation Tier I: 1 Procedure OT Treatments $Self Care/Home Management : 8-22 mins G-Codes:    Lakeita Panther 08-Nov-2013, 10:54 AM Lesle Chris, OTR/L [phone] 2013-11-08

## 2013-10-24 NOTE — Plan of Care (Signed)
Problem: Consults Goal: Diagnosis- Total Joint Replacement Outcome: Completed/Met Date Met:  10/24/13 Primary Total Hip LEFT, Anterior

## 2013-10-24 NOTE — Evaluation (Signed)
Physical Therapy Evaluation Patient Details Name: Clinton Williams MRN: [id number] DOB: 1965/01/29 Today's Date: 10/24/2013   History of Present Illness  s/p L DA THA  Clinical Impression  Pt s/p L THR presents with decreased L LE strength/ROM and post op pain limiting functional mobility.  Pt should progress well to d/c home with family assist and HHPT follow up.    Follow Up Recommendations Home health PT    Equipment Recommendations  Rolling walker with 5" wheels    Recommendations for Other Services OT consult     Precautions / Restrictions Precautions Precautions: None Restrictions Weight Bearing Restrictions: No      Mobility  Bed Mobility Overal bed mobility: Needs Assistance Bed Mobility: Supine to Sit     Supine to sit: Min assist     General bed mobility comments: cues for sequence and use of R LE to self assist  Transfers Overall transfer level: Needs assistance Equipment used: Rolling walker (2 wheeled) Transfers: Sit to/from Stand Sit to Stand: Supervision         General transfer comment: cues for LE placement for comfort  Ambulation/Gait Ambulation/Gait assistance: Min assist;Min guard Ambulation Distance (Feet): 250 Feet Assistive device: Rolling walker (2 wheeled) Gait Pattern/deviations: Step-to pattern;Step-through pattern;Decreased step length - right;Decreased step length - left;Shuffle;Trunk flexed     General Gait Details: cues for posture, position from RW and initial sequence.  Progressed to recip gait on second half  Stairs            Wheelchair Mobility    Modified Rankin (Stroke Patients Only)       Balance                                             Pertinent Vitals/Pain Pain Assessment: No/denies pain Pain Score: 2  Pain Location: L hip Pain Descriptors / Indicators: Aching Pain Intervention(s): Limited activity within patient's tolerance;Monitored during session;Ice applied;Premedicated  before session    Home Living Family/patient expects to be discharged to:: Private residence Living Arrangements: Spouse/significant other Available Help at Discharge: Family Type of Home: House Home Access: Stairs to enter Entrance Stairs-Rails: Right Entrance Stairs-Number of Steps: 5 Home Layout: Two level Home Equipment: Cane - single point Additional Comments: 3:1 and RW delivered to room    Prior Function Level of Independence: Independent               Hand Dominance   Dominant Hand: Right    Extremity/Trunk Assessment   Upper Extremity Assessment: Overall WFL for tasks assessed           Lower Extremity Assessment: LLE deficits/detail   LLE Deficits / Details: 2+/5 hip strength with AAROM at hip to 85 flex and 15 abd  Cervical / Trunk Assessment: Normal  Communication   Communication: No difficulties  Cognition Arousal/Alertness: Awake/alert Behavior During Therapy: WFL for tasks assessed/performed Overall Cognitive Status: Within Functional Limits for tasks assessed                      General Comments      Exercises Total Joint Exercises Ankle Circles/Pumps: AROM;Both;15 reps;Supine Quad Sets: AROM;Both;10 reps;Supine Heel Slides: AAROM;Left;15 reps;Supine Hip ABduction/ADduction: AAROM;10 reps;Supine;Left      Assessment/Plan    PT Assessment Patient needs continued PT services  PT Diagnosis Difficulty walking   PT Problem List Decreased strength;Decreased range of  motion;Decreased activity tolerance;Decreased mobility;Decreased knowledge of use of DME;Pain  PT Treatment Interventions DME instruction;Gait training;Stair training;Functional mobility training;Therapeutic activities;Therapeutic exercise;Patient/family education   PT Goals (Current goals can be found in the Care Plan section) Acute Rehab PT Goals Patient Stated Goal: Back in ~6 weeks for other side PT Goal Formulation: With patient Time For Goal Achievement:  10/31/13 Potential to Achieve Goals: Good    Frequency 7X/week   Barriers to discharge        Co-evaluation               End of Session   Activity Tolerance: Patient tolerated treatment well Patient left: in chair;with call bell/phone within reach;with family/visitor present Nurse Communication: Mobility status         Time: [id number] PT Time Calculation (min): 30 min   Charges:   PT Evaluation $Initial PT Evaluation Tier I: 1 Procedure PT Treatments $Gait Training: 8-22 mins $Therapeutic Exercise: 8-22 mins   PT G Codes:          Clinton Williams 10/24/2013, 12:53 PM

## 2013-10-24 NOTE — Progress Notes (Signed)
     Subjective: 1 Day Post-Op Procedure(s) (LRB): LEFT TOTAL HIP ARTHROPLASTY ANTERIOR APPROACH (Left)   Patient reports pain as mild, pain controlled. No events throughout the night. Pain prior to surgery has resolved, no more sharp pains just soreness. Ready to be discharged home if he does well with PT and pain stays controlled.   Objective:   VITALS:   Filed Vitals:   10/24/13 0756  BP: 113/70  Pulse: 70  Temp: 98.2 F (36.8 C)   Resp: 16    Dorsiflexion/Plantar flexion intact Incision: dressing C/D/I No cellulitis present Compartment soft  LABS  Recent Labs  10/24/13 0445  HGB 11.5*  HCT 33.4*  WBC 13.9*  PLT 217     Recent Labs  10/24/13 0445  NA 135*  K 4.3  BUN 15  CREATININE 0.92  GLUCOSE 192*     Assessment/Plan: 1 Day Post-Op Procedure(s) (LRB): LEFT TOTAL HIP ARTHROPLASTY ANTERIOR APPROACH (Left) Foley cath d/c'ed Advance diet Up with therapy D/C IV fluids Discharge home with home health [facility]. Follow up with OLIN,Valeta Paz D in 2 weeks.  Contact information:  [facility] [address], [address] [place] [id number] [phone]    Overweight (BMI 25-29.9) Estimated body mass index is 28.84 kg/(m^2) as calculated from the following:   Height as of this encounter: 5\' 10"  (1.778 m).   Weight as of this encounter: 91.173 kg (201 lb). Patient also counseled that weight may inhibit the healing process Patient counseled that losing weight will help with future health issues      Anastasio AuerbachMatthew S. Samy Ryner   PAC  10/24/2013, 8:18 AM

## 2013-10-24 NOTE — Care Management Note (Addendum)
    Page 1 of 2   10/24/2013     10:26:40 AM CARE MANAGEMENT NOTE 10/24/2013  Patient:  Burkland,Fraser   Account Number:  [id number]  Date Initiated:  10/24/2013  Documentation initiated by:  [facility]  Subjective/Objective Assessment:   adm: LEFT TOTAL HIP ARTHROPLASTY ANTERIOR APPROACH (Left)     Action/Plan:   discharge planning   Anticipated DC Date:  10/24/2013   Anticipated DC Plan:  [place]  CM consult      [facility] Choice  HOME HEALTH   Choice offered to / List presented to:  C-1 Patient   DME arranged  3-N-1  Vassie Moselle      DME agency  [place] arranged  [place].   Status of service:  Completed, signed off Medicare Important Message given?   (If response is "NO", the following Medicare IM given date fields will be blank) Date Medicare IM given:   Medicare IM given by:   Date Additional Medicare IM given:   Additional Medicare IM given by:    Discharge Disposition:  [place]  Per UR Regulation:  Reviewed for med. necessity/level of care/duration of stay  If discussed at [place] of Stay Meetings, dates discussed:    Comments:  10/24/13 07:45 CM met with pt in room to offer choice of home health agency.  Pt chooses [facility]  to render HHPT. Address and contact information verified with request of mobile phone as primary contact number. Though not on facesheet, Pt has Hulan Fess, MD as his PCP.  DME rep, Pura Spice to deliver 3n1 and rolling walker to room prior to dishcarge.  Referral made to [facility] (on unit). No other CM needs were communicated.  Mariane Masters, BSN, CM [phone].

## 2013-10-25 NOTE — Discharge Summary (Signed)
Physician Discharge Summary  Patient ID: Clinton Williams MRN: [id number] DOB/AGE: September 17, 1965 48 y.o.  Admit date: 10/23/2013 Discharge date: 10/24/2013   Procedures:  Procedure(s) (LRB): LEFT TOTAL HIP ARTHROPLASTY ANTERIOR APPROACH (Left)  Attending Physician:  Dr. Durene Romans   Admission Diagnoses:   Left hip OA / pain  Discharge Diagnoses:  Principal Problem:   S/P left THA, AA Active Problems:   Overweight (BMI 25.0-29.9)  Past Medical History  Diagnosis Date  . Hypertension   . Neuromuscular disorder     oa    HPI: [name], 48 y.o. male, has a history of pain and functional disability in the right hip(s) due to arthritis and patient has failed non-surgical conservative treatments for greater than 12 weeks to include NSAID's and/or analgesics, corticosteriod injections, use of assistive devices and activity modification. Onset of symptoms was gradual starting 2+ years ago with gradually worsening course since that time.The patient noted no past surgery on the left hip(s). Patient currently rates pain in the left hip at 10 out of 10 with activity. Patient has night pain, worsening of pain with activity and weight bearing, trendelenberg gait, pain that interfers with activities of daily living and pain with passive range of motion. Patient has evidence of periarticular osteophytes and joint space narrowing by imaging studies. This condition presents safety issues increasing the risk of falls. There is no current active infection. Risks, benefits and expectations were discussed with the patient. Risks including but not limited to the risk of anesthesia, blood clots, nerve damage, blood vessel damage, failure of the prosthesis, infection and up to and including death. Patient understand the risks, benefits and expectations and wishes to proceed with surgery.   PCP: No primary provider on file.   Discharged Condition: good  Hospital Course:  Patient underwent the above stated  procedure on 10/23/2013. Patient tolerated the procedure well and brought to the recovery room in good condition and subsequently to the floor.  POD #1 BP: 113/70 ; Pulse: 70 ; Temp: 98.2 F (36.8 C) ; Resp: 16  Patient reports pain as mild, pain controlled. No events throughout the night. Pain prior to surgery has resolved, no more sharp pains just soreness. Ready to be discharged home. Dorsiflexion/plantar flexion intact, incision: dressing C/D/I, no cellulitis present and compartment soft.   LABS  Basename    HGB  11.5  HCT  33.4    Discharge Exam: General appearance: alert, cooperative and no distress Extremities: Homans sign is negative, no sign of DVT, no edema, redness or tenderness in the calves or thighs and no ulcers, gangrene or trophic changes  Disposition: Home with follow up in 2 weeks   Follow up with OLIN,Melysa Schroyer D in 2 weeks.  Contact information:  [facility] [address] [place] [address] [phone]    Follow-up Information   Follow up with [facility] (3n1(commode) and rolling walker)    Contact information:   [address] [place] [address] [phone]       Follow up with [facility]. (home health physical therapy)    Contact information:   [address] [place] [address] [phone]       Discharge Instructions   Call MD / Call 911    Complete by:  As directed   If you experience chest pain or shortness of breath, CALL 911 and be transported to the hospital emergency room.  If you develope a fever  above 101 F, pus (white drainage) or increased drainage or redness at the wound, or calf pain, call your surgeon's office.     Change dressing    Complete by:  As directed   Maintain surgical dressing for 10-14 days, or until follow up in the clinic.     Constipation Prevention    Complete by:  As directed   Drink plenty of fluids.  Prune  juice may be helpful.  You may use a stool softener, such as Colace (over the counter) 100 mg twice a day.  Use MiraLax (over the counter) for constipation as needed.     Diet - low sodium heart healthy    Complete by:  As directed      Discharge instructions    Complete by:  As directed   Maintain surgical dressing for 10-14 days, or until follow up in the clinic. [facility] Call with any questions or concerns.     Increase activity slowly as tolerated    Complete by:  As directed      TED hose    Complete by:  As directed   Use stockings (TED hose) for 2 weeks on both leg(s).  You may remove them at night for sleeping.     Weight bearing as tolerated    Complete by:  As directed   Laterality:  left  Extremity:  Lower             Medication List    STOP taking these medications       naproxen sodium 220 MG tablet  Commonly known as:  ANAPROX      TAKE these medications       amLODipine 10 MG tablet  Commonly known as:  NORVASC  Take 10 mg by mouth every evening.     aspirin 325 MG EC tablet  Take 1 tablet (325 mg total) by mouth 2 (two) times daily.     DSS 100 MG Caps  Take 100 mg by mouth 2 (two) times daily.     ferrous sulfate 325 (65 FE) MG tablet  Take 1 tablet (325 mg total) by mouth 3 (three) times daily after meals.     HYDROcodone-acetaminophen 7.5-325 MG per tablet  Commonly known as:  NORCO  Take 1-2 tablets by mouth every 4 (four) hours as needed for moderate pain.     lisinopril 40 MG tablet  Commonly known as:  PRINIVIL,ZESTRIL  Take 40 mg by mouth every evening.     methocarbamol 500 MG tablet  Commonly known as:  ROBAXIN  Take 1 tablet (500 mg total) by mouth every 6 (six) hours as needed for muscle spasms.     multivitamin with minerals Tabs tablet  Take 1 tablet by mouth daily.     polyethylene glycol packet  Commonly known as:  MIRALAX / GLYCOLAX  Take 17 g by mouth 2 (two) times daily.            Signed: Anastasio AuerbachMatthew S. Junita Kubota   PA-C  10/25/2013, 9:21 AM

## 2013-11-14 ENCOUNTER — Encounter (HOSPITAL_COMMUNITY): Payer: Self-pay | Admitting: Pharmacy Technician

## 2013-11-14 NOTE — H&P (Signed)
TOTAL HIP ADMISSION H&P  Patient is admitted for right total hip arthroplasty, anterior approach.  Subjective:  Chief Complaint:    Right hip OA / pain  HPI: Purcell Moutonobert Patnaude, 48 y.o. male, has a history of pain and functional disability in the right hip(s) due to arthritis and patient has failed non-surgical conservative treatments for greater than 12 weeks to include NSAID's and/or analgesics, corticosteriod injections, use of assistive devices and activity modification.  Onset of symptoms was gradual starting 2+ years ago with gradually worsening course since that time.The patient noted prior procedures of the hip to include arthroplasty on the left hip(s).  Patient currently rates pain in the right hip at 9 out of 10 with activity. Patient has night pain, worsening of pain with activity and weight bearing, trendelenberg gait, pain that interfers with activities of daily living and pain with passive range of motion. Patient has evidence of periarticular osteophytes and joint space narrowing by imaging studies. This condition presents safety issues increasing the risk of falls.   There is no current active infection.  Risks, benefits and expectations were discussed with the patient.  Risks including but not limited to the risk of anesthesia, blood clots, nerve damage, blood vessel damage, failure of the prosthesis, infection and up to and including death.  Patient understand the risks, benefits and expectations and wishes to proceed with surgery.   PCP: No primary provider on file.  D/C Plans:      Home with HHPT  Post-op Meds:       No Rx given   Tranexamic Acid:      To be given - IV   Decadron:      Is to be given  FYI:     ASA post-op  Norco post-op    Patient Active Problem List   Diagnosis Date Noted  . Overweight (BMI 25.0-29.9) 10/24/2013  . S/P left THA, AA 10/23/2013   Past Medical History  Diagnosis Date  . Hypertension   . Neuromuscular disorder     oa    Past Surgical  History  Procedure Laterality Date  . Knee arthroscopy with anterior cruciate ligament (acl) repair Bilateral 2003 and 1996  . Knee arthrscopy Right 1981  . Total hip arthroplasty Left 10/23/2013    Procedure: LEFT TOTAL HIP ARTHROPLASTY ANTERIOR APPROACH;  Surgeon: Shelda PalMatthew D Olin, MD;  Location: [facility];  Service: Orthopedics;  Laterality: Left;    No prescriptions prior to admission   No Known Allergies   History  Substance Use Topics  . Smoking status: Never Smoker   . Smokeless tobacco: Never Used  . Alcohol Use: Yes     Comment: occasional       Review of Systems  Constitutional: Negative.   HENT: Negative.   Eyes: Negative.   Respiratory: Negative.   Cardiovascular: Negative.   Gastrointestinal: Negative.   Genitourinary: Negative.   Musculoskeletal: Positive for joint pain.  Skin: Negative.   Neurological: Negative.   Endo/Heme/Allergies: Negative.   Psychiatric/Behavioral: Negative.     Objective:  Physical Exam  Constitutional: He is oriented to person, place, and time. He appears well-developed and well-nourished.  HENT:  Head: Normocephalic and atraumatic.  Eyes: Pupils are equal, round, and reactive to light.  Neck: Neck supple. No JVD present. No tracheal deviation present. No thyromegaly present.  Cardiovascular: Normal rate, regular rhythm, normal heart sounds and intact distal pulses.   Respiratory: Effort normal and breath sounds normal. No stridor. No respiratory distress. He has no  wheezes.  GI: Soft. There is no tenderness. There is no guarding.  Musculoskeletal:       Right hip: He exhibits decreased range of motion, decreased strength, tenderness and bony tenderness. He exhibits no swelling, no deformity and no laceration.  Lymphadenopathy:    He has no cervical adenopathy.  Neurological: He is alert and oriented to person, place, and time.  Skin: Skin is warm and dry.  Psychiatric: He has a normal mood and affect.      Labs:  Estimated  body mass index is 28.84 kg/(m^2) as calculated from the following:   Height as of 10/23/13: 5\' 10"  (1.778 m).   Weight as of 10/23/13: 91.173 kg (201 lb).   Imaging Review Plain radiographs demonstrate severe degenerative joint disease of the right hip(s). The bone quality appears to be good for age and reported activity level.  Assessment/Plan:  End stage arthritis, right hip(s)  The patient history, physical examination, clinical judgement of the provider and imaging studies are consistent with end stage degenerative joint disease of the right hip(s) and total hip arthroplasty is deemed medically necessary. The treatment options including medical management, injection therapy, arthroscopy and arthroplasty were discussed at length. The risks and benefits of total hip arthroplasty were presented and reviewed. The risks due to aseptic loosening, infection, stiffness, dislocation/subluxation,  thromboembolic complications and other imponderables were discussed.  The patient acknowledged the explanation, agreed to proceed with the plan and consent was signed. Patient is being admitted for inpatient treatment for surgery, pain control, PT, OT, prophylactic antibiotics, VTE prophylaxis, progressive ambulation and ADL's and discharge planning.The patient is planning to be discharged home with home health services.      Anastasio AuerbachMatthew S. Franki Stemen   PA-C  11/14/2013, 11:15 AM

## 2013-11-19 NOTE — Patient Instructions (Addendum)
Clinton Williams  11/19/2013   Your procedure is scheduled on: 11/27/2013     Come thru the Emergency Room entrance.    Follow the Signs to Short Stay Center at  0515      am  Call this number if you have problems the morning of surgery: [phone]   Remember:   Do not eat food or drink liquids after midnight.   Take these medicines the morning of surgery with A SIP OF WATER: Hydrocodone if needed    Do not wear jewelry,   Do not wear lotions, powders, or perfumes. deodorant.  . Men may shave face and neck.  Do not bring valuables to the hospital.  Contacts, dentures or bridgework may not be worn into surgery.  Leave suitcase in the car. After surgery it may be brought to your room.  For patients admitted to the hospital, checkout time is 11:00 AM the day of  discharge.    [facility] - Preparing for Surgery Before surgery, you can play an important role.  Because skin is not sterile, your skin needs to be as free of germs as possible.  You can reduce the number of germs on your skin by washing with CHG (chlorahexidine gluconate) soap before surgery.  CHG is an antiseptic cleaner which kills germs and bonds with the skin to continue killing germs even after washing. Please DO NOT use if you have an allergy to CHG or antibacterial soaps.  If your skin becomes reddened/irritated stop using the CHG and inform your nurse when you arrive at Short Stay. Do not shave (including legs and underarms) for at least 48 hours prior to the first CHG shower.  You may shave your face/neck. Please follow these instructions carefully:  1.  Shower with CHG Soap the night before surgery and the  morning of Surgery.  2.  If you choose to wash your hair, wash your hair first as usual with your  normal  shampoo.  3.  After you shampoo, rinse your hair and body thoroughly to remove the  shampoo.                           4.  Use CHG as you would any other liquid soap.  You can apply chg directly  to the skin and wash                        Gently with a scrungie or clean washcloth.  5.  Apply the CHG Soap to your body ONLY FROM THE NECK DOWN.   Do not use on face/ open                           Wound or open sores. Avoid contact with eyes, ears mouth and genitals (private parts).                       Wash face,  Genitals (private parts) with your normal soap.             6.  Wash thoroughly, paying special attention to the area where your surgery  will be performed.  7.  Thoroughly rinse your body with warm water from the neck down.  8.  DO NOT shower/wash with your normal soap after using and rinsing off  the CHG Soap.  9.  Pat yourself dry with a clean towel.            10.  Wear clean pajamas.            11.  Place clean sheets on your bed the night of your first shower and do not  sleep with pets. Day of Surgery : Do not apply any lotions/deodorants the morning of surgery.  Please wear clean clothes to the hospital/surgery center.  FAILURE TO FOLLOW THESE INSTRUCTIONS MAY RESULT IN THE CANCELLATION OF YOUR SURGERY PATIENT SIGNATURE_________________________________  NURSE SIGNATURE__________________________________  ________________________________________________________________________  WHAT IS A BLOOD TRANSFUSION? Blood Transfusion Information  A transfusion is the replacement of blood or some of its parts. Blood is made up of multiple cells which provide different functions.  Red blood cells carry oxygen and are used for blood loss replacement.  White blood cells fight against infection.  Platelets control bleeding.  Plasma helps clot blood.  Other blood products are available for specialized needs, such as hemophilia or other clotting disorders. BEFORE THE TRANSFUSION  Who gives blood for transfusions?   Healthy volunteers who are fully evaluated to make sure their blood is safe. This is blood bank blood. Transfusion therapy is the safest it has ever been in the  practice of medicine. Before blood is taken from a donor, a complete history is taken to make sure that person has no history of diseases nor engages in risky social behavior (examples are intravenous drug use or sexual activity with multiple partners). The donor's travel history is screened to minimize risk of transmitting infections, such as malaria. The donated blood is tested for signs of infectious diseases, such as HIV and hepatitis. The blood is then tested to be sure it is compatible with you in order to minimize the chance of a transfusion reaction. If you or a relative donates blood, this is often done in anticipation of surgery and is not appropriate for emergency situations. It takes many days to process the donated blood. RISKS AND COMPLICATIONS Although transfusion therapy is very safe and saves many lives, the main dangers of transfusion include:  1. Getting an infectious disease. 2. Developing a transfusion reaction. This is an allergic reaction to something in the blood you were given. Every precaution is taken to prevent this. The decision to have a blood transfusion has been considered carefully by your caregiver before blood is given. Blood is not given unless the benefits outweigh the risks. AFTER THE TRANSFUSION  Right after receiving a blood transfusion, you will usually feel much better and more energetic. This is especially true if your red blood cells have gotten low (anemic). The transfusion raises the level of the red blood cells which carry oxygen, and this usually causes an energy increase.  The nurse administering the transfusion will monitor you carefully for complications. HOME CARE INSTRUCTIONS  No special instructions are needed after a transfusion. You may find your energy is better. Speak with your caregiver about any limitations on activity for underlying diseases you may have. SEEK MEDICAL CARE IF:   Your condition is not improving after your transfusion.  You  develop redness or irritation at the intravenous (IV) site. SEEK IMMEDIATE MEDICAL CARE IF:  Any of the following symptoms occur over the next 12 hours:  Shaking chills.  You have a temperature by mouth above 102 F (38.9 C), not controlled by medicine.  Chest, back, or muscle pain.  People around you feel you are not acting correctly or  are confused.  Shortness of breath or difficulty breathing.  Dizziness and fainting.  You get a rash or develop hives.  You have a decrease in urine output.  Your urine turns a dark color or changes to pink, red, or brown. Any of the following symptoms occur over the next 10 days:  You have a temperature by mouth above 102 F (38.9 C), not controlled by medicine.  Shortness of breath.  Weakness after normal activity.  The white part of the eye turns yellow (jaundice).  You have a decrease in the amount of urine or are urinating less often.  Your urine turns a dark color or changes to pink, red, or brown. Document Released: 01/02/2000 Document Revised: 03/29/2011 Document Reviewed: 08/21/2007 ExitCare Patient Information 2014 [place] [place].  _______________________________________________________________________  Incentive Spirometer  An incentive spirometer is a tool that can help keep your lungs clear and active. This tool measures how well you are filling your lungs with each breath. Taking long deep breaths may help reverse or decrease the chance of developing breathing (pulmonary) problems (especially infection) following:  A long period of time when you are unable to move or be active. BEFORE THE PROCEDURE   If the spirometer includes an indicator to show your best effort, your nurse or respiratory therapist will set it to a desired goal.  If possible, sit up straight or lean slightly forward. Try not to slouch.  Hold the incentive spirometer in an upright position. INSTRUCTIONS FOR USE  3. Sit on the edge of your bed if  possible, or sit up as far as you can in bed or on a chair. 4. Hold the incentive spirometer in an upright position. 5. Breathe out normally. 6. Place the mouthpiece in your mouth and seal your lips tightly around it. 7. Breathe in slowly and as deeply as possible, raising the piston or the ball toward the top of the column. 8. Hold your breath for 3-5 seconds or for as long as possible. Allow the piston or ball to fall to the bottom of the column. 9. Remove the mouthpiece from your mouth and breathe out normally. 10. Rest for a few seconds and repeat Steps 1 through 7 at least 10 times every 1-2 hours when you are awake. Take your time and take a few normal breaths between deep breaths. 11. The spirometer may include an indicator to show your best effort. Use the indicator as a goal to work toward during each repetition. 12. After each set of 10 deep breaths, practice coughing to be sure your lungs are clear. If you have an incision (the cut made at the time of surgery), support your incision when coughing by placing a pillow or rolled up towels firmly against it. Once you are able to get out of bed, walk around indoors and cough well. You may stop using the incentive spirometer when instructed by your caregiver.  RISKS AND COMPLICATIONS  Take your time so you do not get dizzy or light-headed.  If you are in pain, you may need to take or ask for pain medication before doing incentive spirometry. It is harder to take a deep breath if you are having pain. AFTER USE  Rest and breathe slowly and easily.  It can be helpful to keep track of a log of your progress. Your caregiver can provide you with a simple table to help with this. If you are using the spirometer at home, follow these instructions: SEEK MEDICAL CARE IF:  You are having difficultly using the spirometer.  You have trouble using the spirometer as often as instructed.  Your pain medication is not giving enough relief while using  the spirometer.  You develop fever of 100.5 F (38.1 C) or higher. SEEK IMMEDIATE MEDICAL CARE IF:   You cough up bloody sputum that had not been present before.  You develop fever of 102 F (38.9 C) or greater.  You develop worsening pain at or near the incision site. MAKE SURE YOU:   Understand these instructions.  Will watch your condition.  Will get help right away if you are not doing well or get worse. Document Released: 05/17/2006 Document Revised: 03/29/2011 Document Reviewed: 07/18/2006 ExitCare Patient Information 2014 [employer], [place].   ________________________________________________________________________   Please read over the following fact sheets that you were given: MRSA Information, coughing and deep breathing exercises, leg exercises

## 2013-11-20 ENCOUNTER — Encounter (HOSPITAL_COMMUNITY)
Admission: RE | Admit: 2013-11-20 | Discharge: 2013-11-20 | Disposition: A | Discharge status: Home / Self Care | Payer: Managed Care, Other (non HMO) | Source: Ambulatory Visit | Attending: Orthopedic Surgery | Admitting: Orthopedic Surgery

## 2013-11-20 ENCOUNTER — Encounter (HOSPITAL_COMMUNITY): Payer: Self-pay

## 2013-11-20 DIAGNOSIS — Z01818 Encounter for other preprocedural examination: Secondary | ICD-10-CM | POA: Insufficient documentation

## 2013-11-20 HISTORY — DX: Anemia, unspecified: D64.9

## 2013-11-20 HISTORY — DX: Unspecified osteoarthritis, unspecified site: M19.90

## 2013-11-20 LAB — BASIC METABOLIC PANEL
Anion gap: 14 (ref 5–15)
BUN: 17 mg/dL (ref 6–23)
CALCIUM: 9.9 mg/dL (ref 8.4–10.5)
CO2: 24 meq/L (ref 19–32)
CREATININE: 0.94 mg/dL (ref 0.50–1.35)
Chloride: 106 mEq/L (ref 96–112)
GFR calc Af Amer: >90 mL/min (ref >90)
GFR calc non Af Amer: >90 mL/min (ref >90)
GLUCOSE: 78 mg/dL (ref 70–99)
Potassium: 4.4 mEq/L (ref 3.7–5.3)
Sodium: 144 mEq/L (ref 137–147)

## 2013-11-20 LAB — URINALYSIS, ROUTINE W REFLEX MICROSCOPIC
Bilirubin Urine: NEGATIVE
GLUCOSE, UA: NEGATIVE mg/dL
Hgb urine dipstick: NEGATIVE
Ketones, ur: NEGATIVE mg/dL
Leukocytes, UA: NEGATIVE
Nitrite: NEGATIVE
PROTEIN: NEGATIVE mg/dL
Specific Gravity, Urine: 1.021 (ref 1.005–1.030)
Urobilinogen, UA: 0.2 mg/dL (ref 0.0–1.0)
pH: 5.5 (ref 5.0–8.0)

## 2013-11-20 LAB — CBC
HEMATOCRIT: 38.4 % — AB (ref 39.0–52.0)
Hemoglobin: 13.2 g/dL (ref 13.0–17.0)
MCH: 29.9 pg (ref 26.0–34.0)
MCHC: 34.4 g/dL (ref 30.0–36.0)
MCV: 86.9 fL (ref 78.0–100.0)
Platelets: 244 10*3/uL (ref 150–400)
RBC: 4.42 MIL/uL (ref 4.22–5.81)
RDW: 12.7 % (ref 11.5–15.5)
WBC: 5.6 10*3/uL (ref 4.0–10.5)

## 2013-11-20 LAB — PROTIME-INR
INR: 0.97 (ref 0.00–1.49)
Prothrombin Time: 13 seconds (ref 11.6–15.2)

## 2013-11-20 LAB — APTT: aPTT: 30 seconds (ref 24–37)

## 2013-11-20 LAB — SURGICAL PCR SCREEN
MRSA, PCR: NEGATIVE
Staphylococcus aureus: NEGATIVE

## 2013-11-20 NOTE — Progress Notes (Addendum)
Patient has no antibiotic ordered preop for surgery on 11/27/2013.

## 2013-11-20 NOTE — Progress Notes (Signed)
EKG- 08/29/13 CHART CXR- 09/19/2013 EPIC

## 2013-11-26 NOTE — Anesthesia Preprocedure Evaluation (Signed)
Anesthesia Evaluation  Patient identified by MRN, date of birth, ID band Patient awake    Reviewed: Allergy & Precautions, H&P , NPO status , Patient's Chart, lab work & pertinent test results  Airway Mallampati: II  TM Distance: >3 FB Neck ROM: Full    Dental no notable dental hx.    Pulmonary neg pulmonary ROS,  breath sounds clear to auscultation  Pulmonary exam normal       Cardiovascular hypertension, Pt. on medications negative cardio ROS  Rhythm:Regular Rate:Normal     Neuro/Psych negative neurological ROS  negative psych ROS   GI/Hepatic negative GI ROS, Neg liver ROS,   Endo/Other  negative endocrine ROS  Renal/GU negative Renal ROS  negative genitourinary   Musculoskeletal negative musculoskeletal ROS (+) Arthritis -, Osteoarthritis,    Abdominal   Peds negative pediatric ROS (+)  Hematology  (+) anemia ,   Anesthesia Other Findings   Reproductive/Obstetrics negative OB ROS                             Anesthesia Physical  Anesthesia Plan  ASA: II  Anesthesia Plan: Spinal   Post-op Pain Management:    Induction: Intravenous  Airway Management Planned: Simple Face Mask  Additional Equipment:   Intra-op Plan:   Post-operative Plan: Extubation in OR  Informed Consent: I have reviewed the patients History and Physical, chart, labs and discussed the procedure including the risks, benefits and alternatives for the proposed anesthesia with the patient or authorized representative who has indicated his/her understanding and acceptance.   Dental advisory given  Plan Discussed with: CRNA  Anesthesia Plan Comments:         Anesthesia Quick Evaluation

## 2013-11-27 ENCOUNTER — Inpatient Hospital Stay (HOSPITAL_COMMUNITY): Payer: Managed Care, Other (non HMO)

## 2013-11-27 ENCOUNTER — Encounter (HOSPITAL_COMMUNITY)
Admission: RE | Disposition: A | Discharge status: Home Health | Payer: Self-pay | Source: Ambulatory Visit | Attending: Orthopedic Surgery

## 2013-11-27 ENCOUNTER — Inpatient Hospital Stay (HOSPITAL_COMMUNITY): Payer: Managed Care, Other (non HMO) | Admitting: Anesthesiology

## 2013-11-27 ENCOUNTER — Inpatient Hospital Stay (HOSPITAL_COMMUNITY)
Admission: RE | Admit: 2013-11-27 | Discharge: 2013-11-28 | DRG: 470 | Disposition: A | Discharge status: Home Health | Payer: Managed Care, Other (non HMO) | Source: Ambulatory Visit | Attending: Orthopedic Surgery | Admitting: Orthopedic Surgery

## 2013-11-27 ENCOUNTER — Encounter (HOSPITAL_COMMUNITY): Payer: Self-pay

## 2013-11-27 DIAGNOSIS — Z01812 Encounter for preprocedural laboratory examination: Secondary | ICD-10-CM

## 2013-11-27 DIAGNOSIS — D62 Acute posthemorrhagic anemia: Secondary | ICD-10-CM | POA: Diagnosis not present

## 2013-11-27 DIAGNOSIS — D500 Iron deficiency anemia secondary to blood loss (chronic): Secondary | ICD-10-CM | POA: Diagnosis not present

## 2013-11-27 DIAGNOSIS — I10 Essential (primary) hypertension: Secondary | ICD-10-CM | POA: Diagnosis present

## 2013-11-27 DIAGNOSIS — Z6828 Body mass index (BMI) 28.0-28.9, adult: Secondary | ICD-10-CM | POA: Diagnosis not present

## 2013-11-27 DIAGNOSIS — M25551 Pain in right hip: Secondary | ICD-10-CM | POA: Diagnosis present

## 2013-11-27 DIAGNOSIS — E663 Overweight: Secondary | ICD-10-CM | POA: Diagnosis present

## 2013-11-27 DIAGNOSIS — Z96649 Presence of unspecified artificial hip joint: Secondary | ICD-10-CM

## 2013-11-27 DIAGNOSIS — Z96642 Presence of left artificial hip joint: Secondary | ICD-10-CM | POA: Diagnosis present

## 2013-11-27 DIAGNOSIS — M1611 Unilateral primary osteoarthritis, right hip: Secondary | ICD-10-CM | POA: Diagnosis present

## 2013-11-27 HISTORY — PX: TOTAL HIP ARTHROPLASTY: SHX124

## 2013-11-27 LAB — TYPE AND SCREEN
ABO/RH(D): O POS
Antibody Screen: NEGATIVE

## 2013-11-27 SURGERY — ARTHROPLASTY, HIP, TOTAL, ANTERIOR APPROACH
Anesthesia: Spinal | Site: Hip | Laterality: Right

## 2013-11-27 MED ORDER — PROPOFOL 10 MG/ML IV BOLUS
INTRAVENOUS | Status: AC
  Filled 2013-11-27: qty 20

## 2013-11-27 MED ORDER — POLYETHYLENE GLYCOL 3350 17 G PO PACK
17.0000 g | PACK | Freq: Two times a day (BID) | ORAL | Status: DC
  Administered 2013-11-27 – 2013-11-28 (×2): 17 g via ORAL

## 2013-11-27 MED ORDER — PROPOFOL 10 MG/ML IV BOLUS
INTRAVENOUS | Status: DC | PRN
  Administered 2013-11-27 (×2): 30 mg via INTRAVENOUS

## 2013-11-27 MED ORDER — DOCUSATE SODIUM 100 MG PO CAPS
100.0000 mg | ORAL_CAPSULE | Freq: Two times a day (BID) | ORAL | Status: DC
  Administered 2013-11-27 – 2013-11-28 (×2): 100 mg via ORAL

## 2013-11-27 MED ORDER — ALUM & MAG HYDROXIDE-SIMETH 200-200-20 MG/5ML PO SUSP: 30.0000 mL | ORAL | Status: DC | PRN

## 2013-11-27 MED ORDER — AMLODIPINE BESYLATE 10 MG PO TABS
10.0000 mg | ORAL_TABLET | Freq: Every evening | ORAL | Status: DC
  Administered 2013-11-27: 10 mg via ORAL
  Filled 2013-11-27 (×2): qty 1

## 2013-11-27 MED ORDER — HYDROCODONE-ACETAMINOPHEN 7.5-325 MG PO TABS
1.0000 | ORAL_TABLET | ORAL | Status: DC
  Administered 2013-11-27: 1 via ORAL
  Administered 2013-11-27 – 2013-11-28 (×6): 2 via ORAL
  Filled 2013-11-27 (×5): qty 2
  Filled 2013-11-27: qty 1
  Filled 2013-11-27: qty 2

## 2013-11-27 MED ORDER — LACTATED RINGERS IV SOLN: INTRAVENOUS | Status: DC

## 2013-11-27 MED ORDER — FENTANYL CITRATE 0.05 MG/ML IJ SOLN
INTRAMUSCULAR | Status: DC | PRN
Start: 2013-11-27 — End: 2013-11-27
  Administered 2013-11-27: 50 ug via INTRAVENOUS

## 2013-11-27 MED ORDER — BUPIVACAINE HCL (PF) 0.5 % IJ SOLN
INTRAMUSCULAR | Status: AC
  Filled 2013-11-27: qty 30

## 2013-11-27 MED ORDER — 0.9 % SODIUM CHLORIDE (POUR BTL) OPTIME
TOPICAL | Status: DC | PRN
  Administered 2013-11-27: 1000 mL

## 2013-11-27 MED ORDER — FERROUS SULFATE 325 (65 FE) MG PO TABS
325.0000 mg | ORAL_TABLET | Freq: Three times a day (TID) | ORAL | Status: DC
  Administered 2013-11-27 – 2013-11-28 (×3): 325 mg via ORAL
  Filled 2013-11-27 (×6): qty 1

## 2013-11-27 MED ORDER — CEFAZOLIN SODIUM-DEXTROSE 2-3 GM-% IV SOLR
INTRAVENOUS | Status: AC
  Filled 2013-11-27: qty 50

## 2013-11-27 MED ORDER — FENTANYL CITRATE 0.05 MG/ML IJ SOLN
INTRAMUSCULAR | Status: AC
  Filled 2013-11-27: qty 2

## 2013-11-27 MED ORDER — BUPIVACAINE HCL (PF) 0.5 % IJ SOLN
INTRAMUSCULAR | Status: DC | PRN
  Administered 2013-11-27: 2.5 mL

## 2013-11-27 MED ORDER — MIDAZOLAM HCL 2 MG/2ML IJ SOLN
INTRAMUSCULAR | Status: AC
  Filled 2013-11-27: qty 2

## 2013-11-27 MED ORDER — MIDAZOLAM HCL 5 MG/5ML IJ SOLN
INTRAMUSCULAR | Status: DC | PRN
  Administered 2013-11-27: 2 mg via INTRAVENOUS

## 2013-11-27 MED ORDER — CELECOXIB 200 MG PO CAPS
200.0000 mg | ORAL_CAPSULE | Freq: Two times a day (BID) | ORAL | Status: DC
  Administered 2013-11-27 – 2013-11-28 (×3): 200 mg via ORAL
  Filled 2013-11-27 (×4): qty 1

## 2013-11-27 MED ORDER — PROMETHAZINE HCL 25 MG/ML IJ SOLN: 6.2500 mg | INTRAMUSCULAR | Status: DC | PRN

## 2013-11-27 MED ORDER — HYDROMORPHONE HCL 1 MG/ML IJ SOLN
0.5000 mg | INTRAMUSCULAR | Status: DC | PRN
  Administered 2013-11-27 (×2): 0.5 mg via INTRAVENOUS
  Filled 2013-11-27 (×2): qty 1

## 2013-11-27 MED ORDER — METOCLOPRAMIDE HCL 5 MG/ML IJ SOLN: 5.0000 mg | Freq: Three times a day (TID) | INTRAMUSCULAR | Status: DC | PRN

## 2013-11-27 MED ORDER — DEXAMETHASONE SODIUM PHOSPHATE 10 MG/ML IJ SOLN
10.0000 mg | Freq: Once | INTRAMUSCULAR | Status: AC
  Administered 2013-11-28: 10 mg via INTRAVENOUS
  Filled 2013-11-27: qty 1

## 2013-11-27 MED ORDER — CEFAZOLIN SODIUM-DEXTROSE 2-3 GM-% IV SOLR
2.0000 g | Freq: Once | INTRAVENOUS | Status: AC
  Administered 2013-11-27: 2 g via INTRAVENOUS

## 2013-11-27 MED ORDER — DIPHENHYDRAMINE HCL 25 MG PO CAPS: 25.0000 mg | ORAL_CAPSULE | Freq: Four times a day (QID) | ORAL | Status: DC | PRN

## 2013-11-27 MED ORDER — ASPIRIN EC 325 MG PO TBEC
325.0000 mg | DELAYED_RELEASE_TABLET | Freq: Two times a day (BID) | ORAL | Status: DC
  Administered 2013-11-28: 325 mg via ORAL
  Filled 2013-11-27 (×3): qty 1

## 2013-11-27 MED ORDER — ONDANSETRON HCL 4 MG/2ML IJ SOLN: 4.0000 mg | Freq: Four times a day (QID) | INTRAMUSCULAR | Status: DC | PRN

## 2013-11-27 MED ORDER — HYDROMORPHONE HCL 1 MG/ML IJ SOLN: 0.2500 mg | INTRAMUSCULAR | Status: DC | PRN

## 2013-11-27 MED ORDER — TRANEXAMIC ACID 100 MG/ML IV SOLN
1000.0000 mg | Freq: Once | INTRAVENOUS | Status: AC
  Administered 2013-11-27: 1000 mg via INTRAVENOUS
  Filled 2013-11-27: qty 10

## 2013-11-27 MED ORDER — OXYCODONE HCL 5 MG PO TABS: 5.0000 mg | ORAL_TABLET | Freq: Once | ORAL | Status: DC | PRN

## 2013-11-27 MED ORDER — OXYCODONE HCL 5 MG/5ML PO SOLN: 5.0000 mg | Freq: Once | ORAL | Status: DC | PRN

## 2013-11-27 MED ORDER — PROPOFOL INFUSION 10 MG/ML OPTIME
INTRAVENOUS | Status: DC | PRN
  Administered 2013-11-27: 200 ug/kg/min via INTRAVENOUS

## 2013-11-27 MED ORDER — POTASSIUM CHLORIDE 2 MEQ/ML IV SOLN
100.0000 mL/h | INTRAVENOUS | Status: DC
  Administered 2013-11-27 (×2): 100 mL/h via INTRAVENOUS
  Filled 2013-11-27 (×6): qty 1000

## 2013-11-27 MED ORDER — METHOCARBAMOL 1000 MG/10ML IJ SOLN
500.0000 mg | Freq: Four times a day (QID) | INTRAVENOUS | Status: DC | PRN
  Administered 2013-11-27: 500 mg via INTRAVENOUS
  Filled 2013-11-27 (×2): qty 5

## 2013-11-27 MED ORDER — LACTATED RINGERS IV SOLN
INTRAVENOUS | Status: DC | PRN
  Administered 2013-11-27 (×4): via INTRAVENOUS

## 2013-11-27 MED ORDER — ONDANSETRON HCL 4 MG PO TABS: 4.0000 mg | ORAL_TABLET | Freq: Four times a day (QID) | ORAL | Status: DC | PRN

## 2013-11-27 MED ORDER — BISACODYL 10 MG RE SUPP: 10.0000 mg | Freq: Every day | RECTAL | Status: DC | PRN

## 2013-11-27 MED ORDER — MAGNESIUM CITRATE PO SOLN: 1.0000 | Freq: Once | ORAL | Status: AC | PRN

## 2013-11-27 MED ORDER — PHENOL 1.4 % MT LIQD
1.0000 | OROMUCOSAL | Status: DC | PRN
  Filled 2013-11-27: qty 177

## 2013-11-27 MED ORDER — MENTHOL 3 MG MT LOZG
1.0000 | LOZENGE | OROMUCOSAL | Status: DC | PRN
  Filled 2013-11-27: qty 9

## 2013-11-27 MED ORDER — CEFAZOLIN SODIUM-DEXTROSE 2-3 GM-% IV SOLR
2.0000 g | Freq: Four times a day (QID) | INTRAVENOUS | Status: AC
  Administered 2013-11-27 (×2): 2 g via INTRAVENOUS
  Filled 2013-11-27 (×2): qty 50

## 2013-11-27 MED ORDER — MEPERIDINE HCL 50 MG/ML IJ SOLN: 6.2500 mg | INTRAMUSCULAR | Status: DC | PRN

## 2013-11-27 MED ORDER — METOCLOPRAMIDE HCL 10 MG PO TABS
5.0000 mg | ORAL_TABLET | Freq: Three times a day (TID) | ORAL | Status: DC | PRN
Start: 2013-11-27 — End: 2013-11-28

## 2013-11-27 MED ORDER — DEXAMETHASONE SODIUM PHOSPHATE 10 MG/ML IJ SOLN
10.0000 mg | Freq: Once | INTRAMUSCULAR | Status: AC
  Administered 2013-11-27: 10 mg via INTRAVENOUS

## 2013-11-27 MED ORDER — METHOCARBAMOL 500 MG PO TABS
500.0000 mg | ORAL_TABLET | Freq: Four times a day (QID) | ORAL | Status: DC | PRN
  Administered 2013-11-27 – 2013-11-28 (×2): 500 mg via ORAL
  Filled 2013-11-27 (×3): qty 1

## 2013-11-27 SURGICAL SUPPLY — 41 items
BAG SPEC THK2 15X12 ZIP CLS (MISCELLANEOUS)
BAG ZIPLOCK 12X15 (MISCELLANEOUS) IMPLANT
CAPT HIP PF COP ×1 IMPLANT
COVER PERINEAL POST (MISCELLANEOUS) ×2 IMPLANT
DRAPE C-ARM 42X120 X-RAY (DRAPES) ×2 IMPLANT
DRAPE STERI IOBAN 125X83 (DRAPES) ×2 IMPLANT
DRAPE U-SHAPE 47X51 STRL (DRAPES) ×6 IMPLANT
DRSG AQUACEL AG ADV 3.5X10 (GAUZE/BANDAGES/DRESSINGS) ×2 IMPLANT
DURAPREP 26ML APPLICATOR (WOUND CARE) ×2 IMPLANT
ELECT BLADE TIP CTD 4 INCH (ELECTRODE) ×2 IMPLANT
ELECT REM PT RETURN 9FT ADLT (ELECTROSURGICAL) ×2
ELECTRODE REM PT RTRN 9FT ADLT (ELECTROSURGICAL) ×1 IMPLANT
FACESHIELD WRAPAROUND (MASK) ×6 IMPLANT
FACESHIELD WRAPAROUND OR TEAM (MASK) ×4 IMPLANT
GLOVE BIO SURGEON STRL SZ7.5 (GLOVE) ×1 IMPLANT
GLOVE BIOGEL PI IND STRL 7.5 (GLOVE) ×1 IMPLANT
GLOVE BIOGEL PI IND STRL 8 (GLOVE) IMPLANT
GLOVE BIOGEL PI IND STRL 8.5 (GLOVE) ×1 IMPLANT
GLOVE BIOGEL PI INDICATOR 7.5 (GLOVE) ×2
GLOVE BIOGEL PI INDICATOR 8 (GLOVE) ×1
GLOVE BIOGEL PI INDICATOR 8.5 (GLOVE) ×1
GLOVE ECLIPSE 7.5 STRL STRAW (GLOVE) ×1 IMPLANT
GLOVE ECLIPSE 8.0 STRL XLNG CF (GLOVE) ×2 IMPLANT
GLOVE ORTHO TXT STRL SZ7.5 (GLOVE) ×4 IMPLANT
GOWN SPEC L3 XXLG W/TWL (GOWN DISPOSABLE) ×3 IMPLANT
GOWN STRL REUS W/TWL LRG LVL3 (GOWN DISPOSABLE) ×2 IMPLANT
GOWN STRL REUS W/TWL XL LVL3 (GOWN DISPOSABLE) ×1 IMPLANT
HOLDER FOLEY CATH W/STRAP (MISCELLANEOUS) ×2 IMPLANT
KIT BASIN OR (CUSTOM PROCEDURE TRAY) ×2 IMPLANT
LIQUID BAND (GAUZE/BANDAGES/DRESSINGS) ×2 IMPLANT
PACK TOTAL JOINT (CUSTOM PROCEDURE TRAY) ×2 IMPLANT
SAW OSC TIP CART 19.5X105X1.3 (SAW) ×2 IMPLANT
SUT MNCRL AB 4-0 PS2 18 (SUTURE) ×2 IMPLANT
SUT VIC AB 1 CT1 36 (SUTURE) ×6 IMPLANT
SUT VIC AB 2-0 CT1 27 (SUTURE) ×4
SUT VIC AB 2-0 CT1 TAPERPNT 27 (SUTURE) ×2 IMPLANT
SUT VLOC 180 0 24IN GS25 (SUTURE) ×2 IMPLANT
TOWEL OR 17X26 10 PK STRL BLUE (TOWEL DISPOSABLE) ×2 IMPLANT
TOWEL OR NON WOVEN STRL DISP B (DISPOSABLE) ×1 IMPLANT
TRAY FOLEY CATH 16FRSI W/METER (SET/KITS/TRAYS/PACK) ×1 IMPLANT
WATER STERILE IRR 1500ML POUR (IV SOLUTION) ×2 IMPLANT

## 2013-11-27 NOTE — Transfer of Care (Signed)
Immediate Anesthesia Transfer of Care Note  Patient: Clinton MoutonRobert Williams  Procedure(s) Performed: Procedure(s): RIGHT TOTAL HIP ARTHROPLASTY ANTERIOR APPROACH (Right)  Patient Location: PACU  Anesthesia Type:Regional and Spinal  Level of Consciousness: awake, alert , oriented and patient cooperative  Airway & Oxygen Therapy: Patient Spontanous Breathing and Patient connected to face mask oxygen  Post-op Assessment: Report given to PACU RN and Post -op Vital signs reviewed and stable  Post vital signs: Reviewed and stable  Complications: No apparent anesthesia complications

## 2013-11-27 NOTE — Op Note (Addendum)
NAME:  Clinton Williams                ACCOUNT NO.: [id number]      MEDICAL RECORD NO.: [id number]      FACILITY:  [facility]      PHYSICIAN:  Durene Romans D  DATE OF BIRTH:  04-Jun-1965     DATE OF PROCEDURE:  11/27/2013                                 OPERATIVE REPORT         PREOPERATIVE DIAGNOSIS: Right  hip osteoarthritis.      POSTOPERATIVE DIAGNOSIS:  Right hip osteoarthritis.      PROCEDURE:  Right total hip replacement through an anterior approach   utilizing DePuy THR system, component size 54mm pinnacle cup, a size 36+4 neutral   Altrex liner, a size 6 Hi Tri Lock stem with a 36+1.5 delta ceramic   ball.      SURGEON:  Clinton Williams. Charlann Williams, M.D.      ASSISTANT:  Clinton Gins, PA-C      ANESTHESIA:  Spinal.      SPECIMENS:  None.      COMPLICATIONS:  None.      BLOOD LOSS:  450 cc     DRAINS:  None.      INDICATION OF THE PROCEDURE:  Clinton Williams is a 48 y.o. male who had   presented to office for evaluation of right hip pain.  He has recent history of a left total hip replacement that he has done very well with and now ready to proceed with this right hip. Radiographs revealed   progressive degenerative changes with bone-on-bone   articulation to the  hip joint.  The patient had painful limited range of   motion significantly affecting their overall quality of life.  The patient was failing to    respond to conservative measures, and at this point was ready   to proceed with more definitive measures.  The patient has noted progressive   degenerative changes in his hip, progressive problems and dysfunction   with regarding the hip prior to surgery.  Consent was obtained for   benefit of pain relief.  Specific risk of infection, DVT, component   failure, dislocation, need for revision surgery, as well discussion of   the anterior versus posterior approach were reviewed.  Consent was   obtained for benefit of anterior pain relief through an  anterior   approach.      PROCEDURE IN DETAIL:  The patient was brought to operative theater.   Once adequate anesthesia, preoperative antibiotics, 2gm of Anecf administered.   The patient was positioned supine on the OSI Hanna table.  Once adequate   padding of boney process was carried out, we had predraped out the hip, and  used fluoroscopy to confirm orientation of the pelvis and position.      The right hip was then prepped and draped from proximal iliac crest to   mid thigh with shower curtain technique.      Time-out was performed identifying the patient, planned procedure, and   extremity.     An incision was then made 2 cm distal and lateral to the   anterior superior iliac spine extending over the orientation of the   tensor fascia lata muscle and sharp dissection was carried down to the   fascia of the muscle  and protractor placed in the soft tissues.      The fascia was then incised.  The muscle belly was identified and swept   laterally and retractor placed along the superior neck.  Following   cauterization of the circumflex vessels and removing some pericapsular   fat, a second cobra retractor was placed on the inferior neck.  A third   retractor was placed on the anterior acetabulum after elevating the   anterior rectus.  A L-capsulotomy was along the line of the   superior neck to the trochanteric fossa, then extended proximally and   distally.  Tag sutures were placed and the retractors were then placed   intracapsular.  We then identified the trochanteric fossa and   orientation of my neck cut, confirmed this radiographically   and then made a neck osteotomy with the femur on traction.  The femoral   head was removed without difficulty or complication.  Traction was let   off and retractors were placed posterior and anterior around the   acetabulum.      The labrum and foveal tissue were debrided.  I began reaming with a 47mm   reamer and reamed up to 53mm  reamer with good bony bed preparation and a 54mm   cup was chosen.  The final 54mm Pinnacle cup was then impacted under fluoroscopy  to confirm the depth of penetration and orientation with respect to   abduction.  A screw was placed followed by the hole eliminator.  The final   36+4 neutral Altrex liner was impacted with good visualized rim fit.  The cup was positioned anatomically within the acetabular portion of the pelvis.      At this point, the femur was rolled at 80 degrees.  Further capsule was   released off the inferior aspect of the femoral neck.  I then   released the superior capsule proximally.  The hook was placed laterally   along the femur and elevated manually and held in position with the bed   hook.  The leg was then extended and adducted with the leg rolled to 100   degrees of external rotation.  Once the proximal femur was fully   exposed, I used a box osteotome to set orientation.  I then began   broaching with the starting chili pepper broach and passed this by hand and then broached up to 6.  With the 6 broach in place I chose a high offset neck and did a trial reduction.  The offset was appropriate, leg lengths   appeared to be equal utilizing these components with the 36+1.5 head ball trial, confirmed radiographically.   Given these findings, I went ahead and dislocated the hip, repositioned all   retractors and positioned the right hip in the extended and abducted position.  The final 6 Hi Tri Lock stem was   chosen and it was impacted down to the level of neck cut.  Based on this   and the trial reduction, a 36+1.5 delta ceramic ball was chosen and   impacted onto a clean and dry trunnion, and the hip was reduced.  The   hip had been irrigated throughout the case again at this point.  I did   reapproximate the superior capsular leaflet to the anterior leaflet   using #1 Vicryl.  The fascia of the   tensor fascia lata muscle was then reapproximated using #1 Vicryl  and #0 V-lock sutures.  The   remaining wound  was closed with 2-0 Vicryl and running 4-0 Monocryl.   The hip was cleaned, dried, and dressed sterilely using Dermabond and   Aquacel dressing.  He was then brought   to recovery room in stable condition tolerating the procedure well.    Clinton GinsMatthew Babish, PA-C was present for the entirety of the case involved from   preoperative positioning, perioperative retractor management, general   facilitation of the case, as well as primary wound closure as assistant.            Clinton FrankelMatthew D. Charlann Boxerlin, M.D.        11/27/2013 8:46 AM

## 2013-11-27 NOTE — Anesthesia Procedure Notes (Signed)
Spinal Patient location during procedure: OR Staffing Anesthesiologist: Nolon Nations R Performed by: anesthesiologist  Preanesthetic Checklist Completed: patient identified, site marked, surgical consent, pre-op evaluation, timeout performed, IV checked, risks and benefits discussed and monitors and equipment checked Spinal Block Patient position: sitting Prep: Betadine Patient monitoring: heart rate, continuous pulse ox and blood pressure Approach: midline Location: L3-4 Injection technique: single-shot Needle Needle type: Sprotte  Needle gauge: 24 G Needle length: 9 cm Additional Notes Expiration date of kit checked and confirmed. Patient tolerated procedure well, without complications.

## 2013-11-27 NOTE — Evaluation (Signed)
Physical Therapy Evaluation Patient Details Name: Clinton MoutonRobert Luff MRN: [id number] DOB: 24-Apr-1965 Today's Date: 11/27/2013   History of Present Illness  s/p R DA THA  Clinical Impression  Patient ambulated x 125', c/o burning thigh on R. Patient will benefit from PT to address problems listed in note below.    Follow Up Recommendations Home health PT;Supervision/Assistance - 24 hour    Equipment Recommendations  None recommended by PT    Recommendations for Other Services       Precautions / Restrictions Precautions Precautions: Fall      Mobility  Bed Mobility Overal bed mobility: Needs Assistance Bed Mobility: Supine to Sit     Supine to sit: Min assist     General bed mobility comments: LLE to edge of bed  Transfers Overall transfer level: Needs assistance Equipment used: Rolling walker (2 wheeled) Transfers: Sit to/from Stand Sit to Stand: Min assist;+2 safety/equipment;From elevated surface         General transfer comment: cues for hand placement  Ambulation/Gait Ambulation/Gait assistance: Min assist;+2 safety/equipment Ambulation Distance (Feet): 125 Feet Assistive device: Rolling walker (2 wheeled) Gait Pattern/deviations: Step-through pattern     General Gait Details: nearly no Research scientist (medical)antalgia  Stairs            Wheelchair Mobility    Modified Rankin (Stroke Patients Only)       Balance                                             Pertinent Vitals/Pain Pain Assessment: 0-10 Pain Score: 5  Pain Location: both thighs Pain Descriptors / Indicators: Burning Pain Intervention(s): Monitored during session;Premedicated before session;Ice applied;Repositioned    Home Living Family/patient expects to be discharged to:: Private residence Living Arrangements: Spouse/significant other Available Help at Discharge: Family Type of Home: House Home Access: Stairs to enter Entrance Stairs-Rails: Right Entrance Stairs-Number of  Steps: 5 Home Layout: Two level Home Equipment: Cane - single point;Walker - 2 wheels;Bedside commode      Prior Function Level of Independence: Independent               Hand Dominance        Extremity/Trunk Assessment   Upper Extremity Assessment: Overall WFL for tasks assessed           Lower Extremity Assessment: LLE deficits/detail   LLE Deficits / Details: able to advance during  gait,  Cervical / Trunk Assessment: Normal  Communication   Communication: No difficulties  Cognition Arousal/Alertness: Awake/alert Behavior During Therapy: WFL for tasks assessed/performed Overall Cognitive Status: Within Functional Limits for tasks assessed                      General Comments      Exercises        Assessment/Plan    PT Assessment Patient needs continued PT services  PT Diagnosis Difficulty walking;Acute pain   PT Problem List Decreased strength;Decreased range of motion;Decreased activity tolerance;Decreased mobility;Pain  PT Treatment Interventions DME instruction;Gait training;Stair training;Functional mobility training;Therapeutic activities;Therapeutic exercise;Patient/family education   PT Goals (Current goals can be found in the Care Plan section) Acute Rehab PT Goals Patient Stated Goal: to walk normally PT Goal Formulation: With patient Time For Goal Achievement: 11/29/13 Potential to Achieve Goals: Good    Frequency 7X/week   Barriers to discharge  Co-evaluation               End of Session   Activity Tolerance: Patient tolerated treatment well Patient left: in chair;with call bell/phone within reach;with family/visitor present Nurse Communication: Mobility status         Time: [id number] PT Time Calculation (min) (ACUTE ONLY): 12 min   Charges:   PT Evaluation $Initial PT Evaluation Tier I: 1 Procedure     PT G CodesRada Hay:          Anny Sayler Elizabeth 11/27/2013, 6:02 PM

## 2013-11-27 NOTE — Care Management Note (Unsigned)
    Page 1 of 1   11/27/2013     2:18:07 PM CARE MANAGEMENT NOTE 11/27/2013  Patient:  Clinton Williams,Clinton Williams   Account Number:  [id number]  Date Initiated:  11/27/2013  Documentation initiated by:  [facility]  Subjective/Objective Assessment:   adm: Right total hip replacement through an anterior approach       Action/Plan:   discharge planning   Anticipated DC Date:  11/28/2013   Anticipated DC Plan:  [place]  CM consult      [facility] Choice  HOME HEALTH   Choice offered to / List presented to:  C-1 Patient   DME arranged  NA      DME agency  NA     [place] arranged  [place] - 11 Patient Refused      Status of service:  Completed, signed off Medicare Important Message given?   (If response is "NO", the following Medicare IM given date fields will be blank) Date Medicare IM given:   Medicare IM given by:   Date Additional Medicare IM given:   Additional Medicare IM given by:    Discharge Disposition:  HOME/SELF CARE  Per UR Regulation:    If discussed at Long Length of Stay Meetings, dates discussed:    Comments:  11/27/13 14:00 CM met with pt in room to offer choice of home health agency.  Pt states he just had the same surgery less than 5 weks ago; he states he feels comofrtable going home with no HHPT.  Pt has rolling walker and 3n1 at home. Pt going home with support of wife.  No other CM needs were communicated.  Mariane Masters, BSN, CM [phone].

## 2013-11-27 NOTE — Anesthesia Postprocedure Evaluation (Signed)
Anesthesia Post Note  Patient: Clinton MoutonRobert Williams  Procedure(s) Performed: Procedure(s) (LRB): RIGHT TOTAL HIP ARTHROPLASTY ANTERIOR APPROACH (Right)  Anesthesia type: Spinal  Patient location: PACU  Post pain: Pain level controlled  Post assessment: Post-op Vital signs reviewed  Last Vitals: BP 114/71 mmHg  Pulse 72  Temp(Src) 36.3 C (Oral)  Resp 15  SpO2 97%  Post vital signs: Reviewed  Level of consciousness: sedated  Complications: No apparent anesthesia complications

## 2013-11-27 NOTE — Interval H&P Note (Signed)
History and Physical Interval Note:  11/27/2013 6:59 AM  Clinton Williams  has presented today for surgery, with the diagnosis of RIGHT HIP OA  The various methods of treatment have been discussed with the patient and family. After consideration of risks, benefits and other options for treatment, the patient has consented to  Procedure(s): RIGHT TOTAL HIP ARTHROPLASTY ANTERIOR APPROACH (Right) as a surgical intervention .  The patient's history has been reviewed, patient examined, no change in status, stable for surgery.  I have reviewed the patient's chart and labs.  Questions were answered to the patient's satisfaction.     Shelda PalLIN,Oziel Beitler D

## 2013-11-28 DIAGNOSIS — D500 Iron deficiency anemia secondary to blood loss (chronic): Secondary | ICD-10-CM | POA: Diagnosis not present

## 2013-11-28 LAB — CBC
HCT: 32.2 % — ABNORMAL LOW (ref 39.0–52.0)
HEMOGLOBIN: 10.9 g/dL — AB (ref 13.0–17.0)
MCH: 29.5 pg (ref 26.0–34.0)
MCHC: 33.9 g/dL (ref 30.0–36.0)
MCV: 87 fL (ref 78.0–100.0)
Platelets: 211 10*3/uL (ref 150–400)
RBC: 3.7 MIL/uL — AB (ref 4.22–5.81)
RDW: 13 % (ref 11.5–15.5)
WBC: 13.2 10*3/uL — AB (ref 4.0–10.5)

## 2013-11-28 LAB — BASIC METABOLIC PANEL
Anion gap: 11 (ref 5–15)
BUN: 12 mg/dL (ref 6–23)
CALCIUM: 8.8 mg/dL (ref 8.4–10.5)
CHLORIDE: 105 meq/L (ref 96–112)
CO2: 23 meq/L (ref 19–32)
CREATININE: 0.74 mg/dL (ref 0.50–1.35)
GFR calc Af Amer: >90 mL/min (ref >90)
GFR calc non Af Amer: >90 mL/min (ref >90)
GLUCOSE: 168 mg/dL — AB (ref 70–99)
Potassium: 4.2 mEq/L (ref 3.7–5.3)
Sodium: 139 mEq/L (ref 137–147)

## 2013-11-28 MED ORDER — ASPIRIN 325 MG PO TBEC: 325.0000 mg | DELAYED_RELEASE_TABLET | Freq: Two times a day (BID) | ORAL | Status: AC

## 2013-11-28 MED ORDER — HYDROCODONE-ACETAMINOPHEN 7.5-325 MG PO TABS
1.0000 | ORAL_TABLET | ORAL | Status: AC | PRN
Start: 2013-11-28 — End: ?

## 2013-11-28 MED ORDER — METHOCARBAMOL 500 MG PO TABS: 500.0000 mg | ORAL_TABLET | Freq: Four times a day (QID) | ORAL | Status: AC | PRN

## 2013-11-28 NOTE — Care Management Note (Signed)
UR Complete.  Miki Labuda, BSN, CM [phone]. 

## 2013-11-28 NOTE — Progress Notes (Signed)
     Subjective: 1 Day Post-Op Procedure(s) (LRB): RIGHT TOTAL HIP ARTHROPLASTY ANTERIOR APPROACH (Right)   Patient reports pain as mild, pain controlled. Little burning over the incision, which is a little different from the last surgery.  No events throughout the night. Ready to be discharged home.  Objective:   VITALS:   Filed Vitals:   11/28/13  BP: 121/75  Pulse: 72  Temp: 97.7 F (36.5 C)   Resp: 16    Neurovascular intact Dorsiflexion/Plantar flexion intact Incision: dressing C/D/I No cellulitis present Compartment soft  LABS  Recent Labs  11/28/13 0423  HGB 10.9*  HCT 32.2*  WBC 13.2*  PLT 211     Recent Labs  11/28/13 0423  NA 139  K 4.2  BUN 12  CREATININE 0.74  GLUCOSE 168*     Assessment/Plan: 1 Day Post-Op Procedure(s) (LRB): RIGHT TOTAL HIP ARTHROPLASTY ANTERIOR APPROACH (Right) Foley cath d/c'ed Advance diet Up with therapy D/C IV fluids Discharge home with home health  [facility] Follow up with OLIN,Chan Sheahan D in 2 weeks.  Contact information:  [facility] [address], [address] [place] [id number] [phone]    Expected ABLA  Treated with iron and will observe  Overweight (BMI 25-29.9) Estimated body mass index is 28.79 kg/(m^2) as calculated from the following:   Height as of this encounter: 5\' 10"  (1.778 m).   Weight as of this encounter: 91 kg (200 lb 9.9 oz). Patient also counseled that weight may inhibit the healing process Patient counseled that losing weight will help with future health issues      Anastasio AuerbachMatthew S. Jaeveon [place]   PAC  11/28/2013, 9:11 AM

## 2013-11-28 NOTE — Plan of Care (Signed)
Problem: Phase I Progression Outcomes Goal: Other Phase I Outcomes/Goals Outcome: Completed/Met Date Met:  11/28/13     

## 2013-11-28 NOTE — Plan of Care (Signed)
Problem: Phase I Progression Outcomes Goal: Hemodynamically stable Outcome: Completed/Met Date Met:  11/28/13     

## 2013-11-28 NOTE — Plan of Care (Signed)
Problem: Phase I Progression Outcomes Goal: CMS/Neurovascular status WDL Outcome: Completed/Met Date Met:  11/28/13     

## 2013-11-28 NOTE — Progress Notes (Signed)
Physical Therapy Treatment Patient Details Name: Clinton MoutonRobert Williams MRN: [id number] DOB: 1965/08/02 Today's Date: 11/28/2013    History of Present Illness s/p R DA THA    PT Comments    Pt feeling very well, ready for DC.  Follow Up Recommendations  Home health PT;Supervision/Assistance - 24 hour     Equipment Recommendations       Recommendations for Other Services       Precautions / Restrictions Precautions Precautions: Fall    Mobility  Bed Mobility         Supine to sit: Supervision        Transfers   Equipment used: Rolling walker (2 wheeled) Transfers: Sit to/from Stand Sit to Stand: Supervision            Ambulation/Gait Ambulation/Gait assistance: Supervision Ambulation Distance (Feet): 400 Feet Assistive device: Rolling walker (2 wheeled) Gait Pattern/deviations: Step-through pattern     General Gait Details: nearly no antalgia   Stairs Stairs: Yes Stairs assistance: Min guard Stair Management: One rail Right;Step to pattern;Forwards;With cane Number of Stairs: 6 General stair comments: cues for sequence, patient recently did opposite and required extra practice this  time for safety  Wheelchair Mobility    Modified Rankin (Stroke Patients Only)       Balance                                    Cognition Arousal/Alertness: Awake/alert                          Exercises Total Joint Exercises Quad Sets: AROM;Right;10 reps;Supine Short Arc Quad: AROM;Right;10 reps;Supine Heel Slides: AROM;Right;10 reps;Supine Hip ABduction/ADduction: AROM;Right;10 reps;Supine Long Arc Quad: AROM;Right;10 reps;Seated    General Comments        Pertinent Vitals/Pain Pain Score: 1  Pain Location: R thigh Pain Descriptors / Indicators: Discomfort;Sore Pain Intervention(s): Premedicated before session;Monitored during session    Home Living                      Prior Function            PT Goals  (current goals can now be found in the care plan section) Progress towards PT goals: Progressing toward goals    Frequency       PT Plan Current plan remains appropriate    Co-evaluation             End of Session   Activity Tolerance: Patient tolerated treatment well Patient left: with family/visitor present     Time: [id number] PT Time Calculation (min) (ACUTE ONLY): 22 min  Charges:  $Gait Training: 8-22 mins                    G Codes:      Rada HayHill, Curlie Macken Elizabeth 11/28/2013, 1:37 PM

## 2013-11-28 NOTE — Plan of Care (Signed)
Problem: Phase II Progression Outcomes Goal: Ambulates Outcome: Completed/Met Date Met:  11/28/13 Ambulated in hall with physical therapy

## 2013-11-28 NOTE — Plan of Care (Signed)
Problem: Phase II Progression Outcomes Goal: Other Phase II Outcomes/Goals Outcome: Completed/Met Date Met:  11/28/13

## 2013-11-28 NOTE — Plan of Care (Signed)
Problem: Phase II Progression Outcomes Goal: Tolerating diet Outcome: Completed/Met Date Met:  11/28/13

## 2013-11-28 NOTE — Plan of Care (Signed)
Problem: Phase I Progression Outcomes Goal: Dangle or out of bed evening of surgery Outcome: Completed/Met Date Met:  11/28/13     

## 2013-11-28 NOTE — Plan of Care (Signed)
Problem: Phase I Progression Outcomes Goal: Pain controlled with appropriate interventions Outcome: Completed/Met Date Met:  11/28/13     

## 2013-11-28 NOTE — Progress Notes (Signed)
RN reviewed discharge instructions with patient and family. All questions answered.   Paperwork and prescriptions given to patient.   NT rolled patient down in wheelchair to family car.  

## 2013-11-28 NOTE — Plan of Care (Signed)
Problem: Phase I Progression Outcomes Goal: Initial discharge plan identified Outcome: Completed/Met Date Met:  11/28/13     

## 2013-11-28 NOTE — Progress Notes (Signed)
OT Cancellation Note  Patient Details Name: Purcell MoutonRobert Paczkowski MRN: [id number] DOB: [birth year]   Cancelled Treatment:    Reason Eval/Treat Not Completed: Other (comment).  Pt had other hip replaced last month.  No OT needs.  Will sign off.  Damiano Stamper 11/28/2013, 7:32 AM  Marica OtterMaryellen Kariana Wiles, OTR/L [phone] 11/28/2013

## 2013-11-28 NOTE — Plan of Care (Signed)
Problem: Phase II Progression Outcomes Goal: Discharge plan established Outcome: Completed/Met Date Met:  11/28/13

## 2013-12-05 NOTE — Discharge Summary (Signed)
Physician Discharge Summary  Patient ID: Clinton Williams MRN: [id number] DOB/AGE: [birth year] 50 y.o.  Admit date: 11/27/2013 Discharge date: 11/28/2013   Procedures:  Procedure(s) (LRB): RIGHT TOTAL HIP ARTHROPLASTY ANTERIOR APPROACH (Right)  Attending Physician:  Dr. Durene RomansMatthew Olin   Admission Diagnoses:   Right hip primary OA / pain  Discharge Diagnoses:  Principal Problem:   S/P right THA, AA Active Problems:   Overweight (BMI 25.0-29.9)   Expected blood loss anemia  Past Medical History  Diagnosis Date  . Hypertension   . Neuromuscular disorder     oa  . Arthritis   . Anemia     HPI: Clinton Williams, 48 y.o. male, has a history of pain and functional disability in the right hip(s) due to arthritis and patient has failed non-surgical conservative treatments for greater than 12 weeks to include NSAID's and/or analgesics, corticosteriod injections, use of assistive devices and activity modification. Onset of symptoms was gradual starting 2+ years ago with gradually worsening course since that time.The patient noted prior procedures of the hip to include arthroplasty on the left hip(s). Patient currently rates pain in the right hip at 9 out of 10 with activity. Patient has night pain, worsening of pain with activity and weight bearing, trendelenberg gait, pain that interfers with activities of daily living and pain with passive range of motion. Patient has evidence of periarticular osteophytes and joint space narrowing by imaging studies. This condition presents safety issues increasing the risk of falls. There is no current active infection. Risks, benefits and expectations were discussed with the patient. Risks including but not limited to the risk of anesthesia, blood clots, nerve damage, blood vessel damage, failure of the prosthesis, infection and up to and including death. Patient understand the risks, benefits and expectations and wishes to proceed with surgery.   PCP:  Mickie HillierLITTLE,KEVIN LORNE, MD   Discharged Condition: good  Hospital Course:  Patient underwent the above stated procedure on 11/27/2013. Patient tolerated the procedure well and brought to the recovery room in good condition and subsequently to the floor.  POD #1 BP: 121/75 ; Pulse: 72 ; Temp: 97.7 F (36.5 C) ; Resp: 16  Patient reports pain as mild, pain controlled. Little burning over the incision, which is a little different from the last surgery. No events throughout the night. Ready to be discharged home. Dorsiflexion/plantar flexion intact, incision: dressing C/D/I, no cellulitis present and compartment soft.   LABS  Basename    HGB  10.9  HCT  32.2     Discharge Exam: General appearance: alert, cooperative and no distress Extremities: Homans sign is negative, no sign of DVT, no edema, redness or tenderness in the calves or thighs and no ulcers, gangrene or trophic changes  Disposition: Home with follow up in 2 weeks   Follow-up Information    Follow up with Shelda PalLIN,Leevi Cullars D, MD. Schedule an appointment as soon as possible for a visit in 2 weeks.   Specialty:  Orthopedic Surgery   Contact information:   [address] [place] [address] [phone]       Discharge Instructions    Call MD / Call 911    Complete by:  As directed   If you experience chest pain or shortness of breath, CALL 911 and be transported to the hospital emergency room.  If you develope a fever above 101 F, pus (white drainage) or increased drainage or redness at the wound, or calf pain, call your surgeon's office.  Change dressing    Complete by:  As directed   Maintain surgical dressing for 10-14 days, or until follow up in the clinic.     Constipation Prevention    Complete by:  As directed   Drink plenty of fluids.  Prune juice may be helpful.  You may use a stool softener, such as Colace (over the counter) 100 mg twice a day.  Use MiraLax (over the counter) for constipation  as needed.     Diet - low sodium heart healthy    Complete by:  As directed      Discharge instructions    Complete by:  As directed   Maintain surgical dressing for 10-14 days, or until follow up in the clinic. [facility] Call with any questions or concerns.     Increase activity slowly as tolerated    Complete by:  As directed      TED hose    Complete by:  As directed   Use stockings (TED hose) for 2 weeks on both leg(s).  You may remove them at night for sleeping.     Weight bearing as tolerated    Complete by:  As directed   Laterality:  right  Extremity:  Lower             Medication List    STOP taking these medications        aspirin 325 MG tablet  Replaced by:  aspirin 325 MG EC tablet      TAKE these medications        amLODipine 10 MG tablet  Commonly known as:  NORVASC  Take 10 mg by mouth every evening.     aspirin 325 MG EC tablet  Take 1 tablet (325 mg total) by mouth 2 (two) times daily.     docusate sodium 100 MG capsule  Commonly known as:  COLACE  Take 100 mg by mouth 2 (two) times daily.     ferrous fumarate 325 (106 FE) MG Tabs tablet  Commonly known as:  HEMOCYTE - 106 mg FE  Take 1 tablet by mouth.     HYDROcodone-acetaminophen 7.5-325 MG per tablet  Commonly known as:  NORCO  Take 1-2 tablets by mouth every 4 (four) hours as needed for moderate pain.     lisinopril 40 MG tablet  Commonly known as:  PRINIVIL,ZESTRIL  Take 40 mg by mouth every evening.     methocarbamol 500 MG tablet  Commonly known as:  ROBAXIN  Take 1 tablet (500 mg total) by mouth every 6 (six) hours as needed for muscle spasms.     multivitamin with minerals Tabs tablet  Take 1 tablet by mouth daily.     polyethylene glycol packet  Commonly known as:  MIRALAX / GLYCOLAX  Take 17 g by mouth daily.         Signed: Anastasio AuerbachMatthew S. Niah Heinle   PA-C  12/05/2013, 4:14 PM

## 2015-05-09 IMAGING — DX DG PORTABLE PELVIS
1 series · 1 of 1 positions shown · non-contrast
Comparison: Cross-table lateral left hip image obtained
concurrently.

CLINICAL DATA: Postop left total hip arthroplasty for
osteoarthritis.

EXAM:
PORTABLE PELVIS 1-2 VIEWS

[pelvis ap]
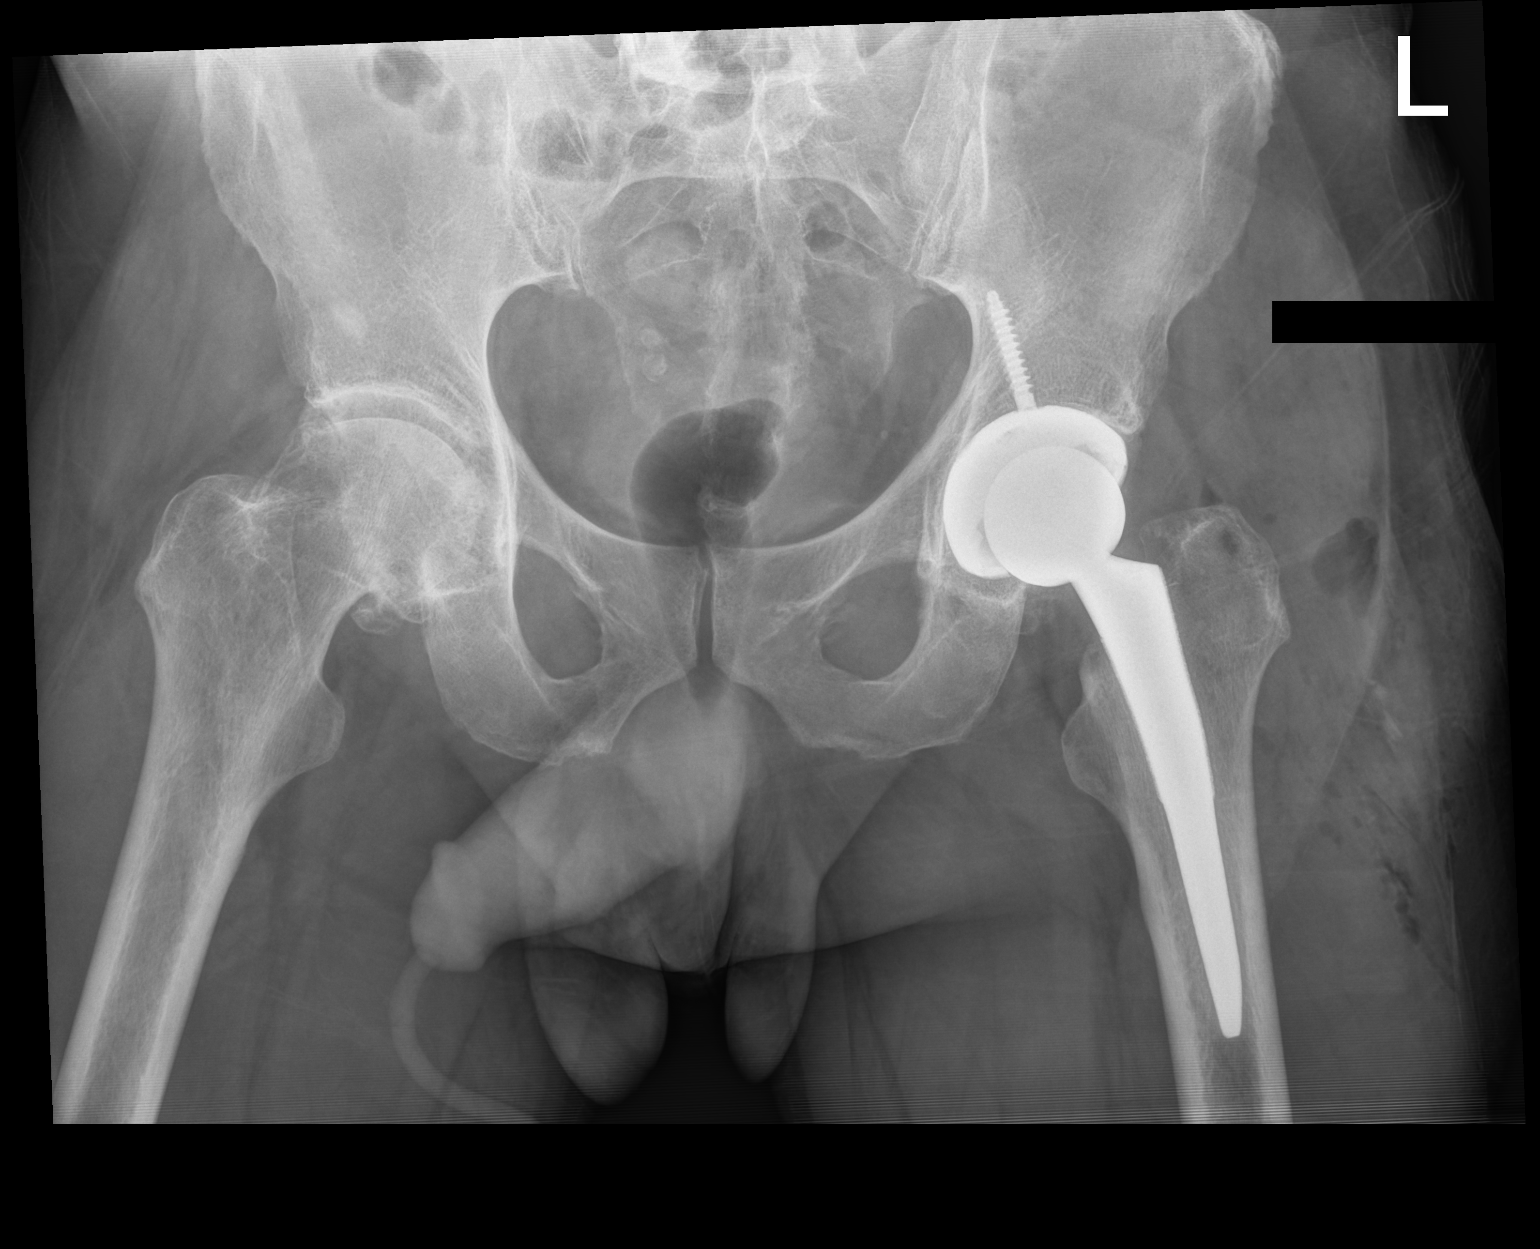

[1 of 1 positions shown; findings below may reference images not displayed]

FINDINGS: Anatomic alignment post left total hip arthroplasty. No acute
complicating features. Severe osteoarthritis in the right hip. Bone
island in the roof of the right acetabulum. Sacroiliac joints and
symphysis pubis intact.
IMPRESSION: Anatomic alignment post left total hip arthroplasty without acute
complicating features.

## 2015-05-09 IMAGING — DX DG HIP 1V PORT*L*
1 series · 1 of 1 positions shown · non-contrast
Comparison: Portable AP pelvis x-ray obtained concurrently.

CLINICAL DATA: Postop left hip arthroplasty.

EXAM:
PORTABLE LEFT HIP - 1 VIEW

[hip x-table]
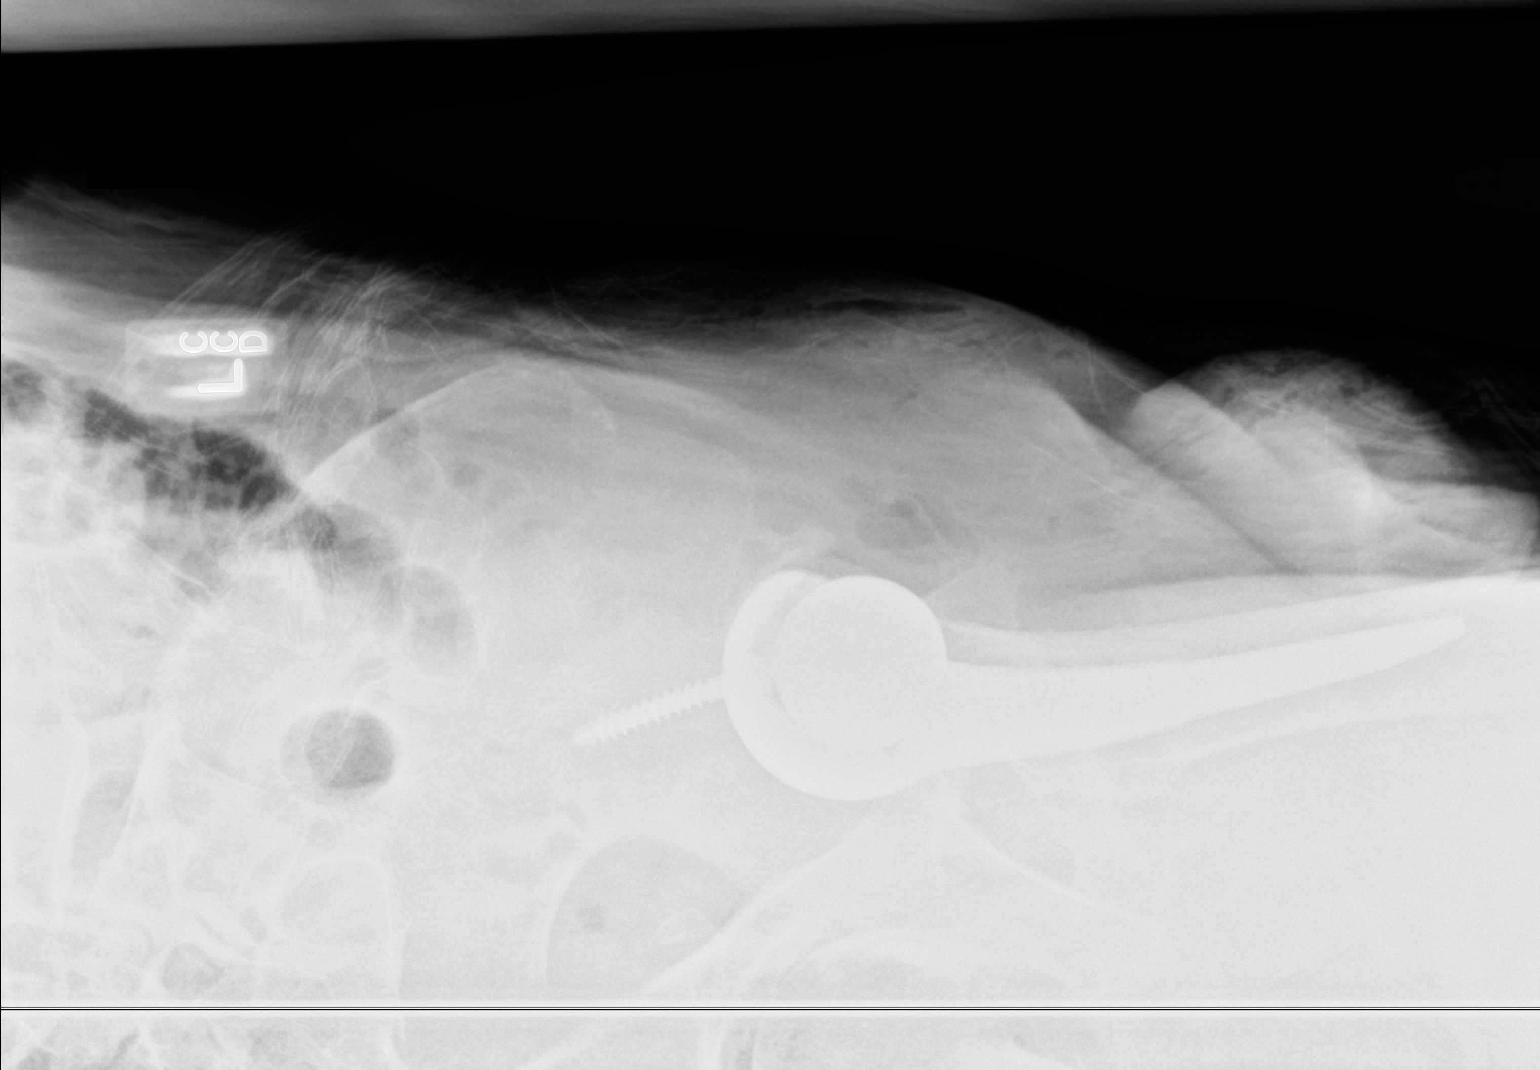

[1 of 1 positions shown; findings below may reference images not displayed]

FINDINGS: Anatomic alignment of the left hip prosthesis in the cross-table
lateral projection. No acute complicating features.
IMPRESSION: Anatomic alignment post left total hip arthroplasty without acute
complicating features.

## 2015-06-13 IMAGING — DX DG PORTABLE PELVIS
1 series · 1 of 1 positions shown · non-contrast
Comparison: 10/23/2013

CLINICAL DATA: Postop right hip replacement.

EXAM:
PORTABLE PELVIS 1-2 VIEWS

[pelvis ap]
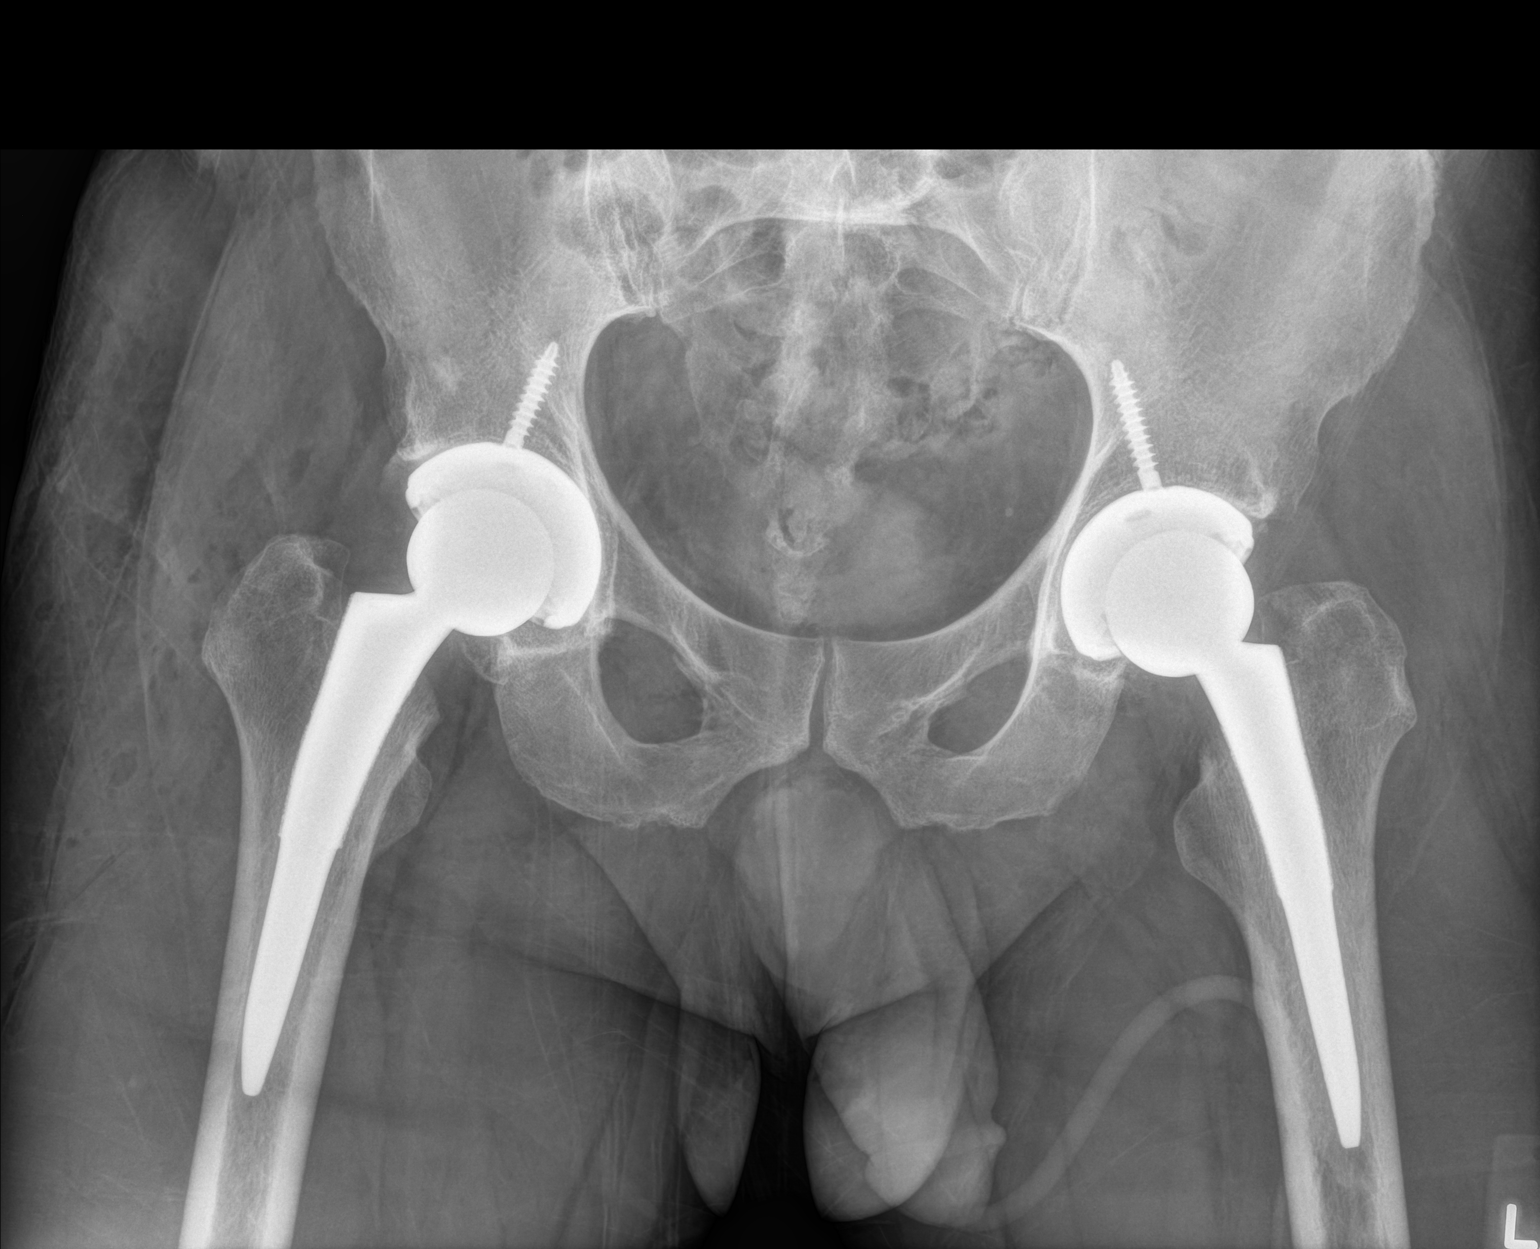

[1 of 1 positions shown; findings below may reference images not displayed]

FINDINGS: Remote changes of left hip replacement. Interval right hip
replacement. Normal AP alignment. No hardware or bony complicating
feature.
IMPRESSION: Right hip replacement without complicating feature.
